# Patient Record
Sex: Male | Born: 1965
Health system: Southern US, Community
[De-identification: ages and names within clinical notes are randomized; demographics above are authoritative.]

## PROBLEM LIST (undated history)

## (undated) DIAGNOSIS — R7611 Nonspecific reaction to tuberculin skin test without active tuberculosis: Secondary | ICD-10-CM

## (undated) DIAGNOSIS — E785 Hyperlipidemia, unspecified: Secondary | ICD-10-CM

## (undated) DIAGNOSIS — E669 Obesity, unspecified: Secondary | ICD-10-CM

## (undated) HISTORY — DX: Nonspecific reaction to tuberculin skin test without active tuberculosis: R76.11

## (undated) HISTORY — DX: Obesity, unspecified: E66.9

## (undated) HISTORY — PX: OTHER SURGICAL HISTORY: SHX169

## (undated) HISTORY — DX: Hyperlipidemia, unspecified: E78.5

---

## 2001-06-24 ENCOUNTER — Emergency Department (HOSPITAL_COMMUNITY): Admission: EM | Admit: 2001-06-24 | Discharge: 2001-06-24 | Payer: Self-pay | Admitting: Emergency Medicine

## 2001-06-24 ENCOUNTER — Encounter: Payer: Self-pay | Admitting: Emergency Medicine

## 2003-11-06 ENCOUNTER — Ambulatory Visit (HOSPITAL_COMMUNITY): Admission: RE | Admit: 2003-11-06 | Discharge: 2003-11-06 | Payer: Self-pay | Admitting: Pediatrics

## 2004-02-01 ENCOUNTER — Inpatient Hospital Stay (HOSPITAL_COMMUNITY): Admission: EM | Admit: 2004-02-01 | Discharge: 2004-02-01 | Payer: Self-pay | Admitting: Emergency Medicine

## 2005-11-18 ENCOUNTER — Ambulatory Visit: Payer: Self-pay | Admitting: Family Medicine

## 2006-05-22 ENCOUNTER — Emergency Department (HOSPITAL_COMMUNITY): Admission: EM | Admit: 2006-05-22 | Discharge: 2006-05-22 | Payer: Self-pay | Admitting: Family Medicine

## 2006-11-19 ENCOUNTER — Emergency Department (HOSPITAL_COMMUNITY): Admission: EM | Admit: 2006-11-19 | Discharge: 2006-11-19 | Payer: Self-pay | Admitting: Emergency Medicine

## 2007-03-01 ENCOUNTER — Ambulatory Visit: Payer: Self-pay | Admitting: Family Medicine

## 2007-12-09 ENCOUNTER — Ambulatory Visit: Payer: Self-pay | Admitting: Family Medicine

## 2007-12-23 ENCOUNTER — Ambulatory Visit: Payer: Self-pay | Admitting: Family Medicine

## 2008-07-05 ENCOUNTER — Ambulatory Visit: Payer: Self-pay | Admitting: Family Medicine

## 2008-12-11 ENCOUNTER — Ambulatory Visit: Payer: Self-pay | Admitting: Family Medicine

## 2008-12-22 ENCOUNTER — Ambulatory Visit: Payer: Self-pay | Admitting: Family Medicine

## 2010-01-11 ENCOUNTER — Ambulatory Visit: Payer: Self-pay | Admitting: Family Medicine

## 2010-03-12 ENCOUNTER — Ambulatory Visit: Payer: Self-pay | Admitting: Family Medicine

## 2010-05-09 ENCOUNTER — Ambulatory Visit: Payer: Self-pay | Admitting: Family Medicine

## 2010-06-08 ENCOUNTER — Encounter: Payer: Self-pay | Admitting: Neurology

## 2010-10-04 NOTE — H&P (Signed)
Jay Rosario                         ACCOUNT NO.:  [id number]   MEDICAL RECORD NO.:  [id number]                   PATIENT TYPE:  INP   LOCATION:  2016                                 FACILITY:  [facility]   PHYSICIAN:  Corinna L. Lendell Caprice, MD             DATE OF BIRTH:  26-Dec-1965   DATE OF ADMISSION:  01/31/2004  DATE OF DISCHARGE:                                HISTORY & PHYSICAL   CHIEF COMPLAINT:  Chest pain.   HISTORY OF PRESENT ILLNESS:  Jay Rosario is a 45 year old black male with  no past medical history who presents to the emergency room with complaints  of a several day history of pleuritic chest pain.  It is located in the left  chest, substernal and back area, worsened by some twisting motion, by some  sitting forward, and certainly with inspiration.  He has had no cough.  He  has had slight shortness of breath.  He has had no recent travel.  He has no  leg pain or swelling.  He has no history of thromboembolism.  He has felt  this in the past but it usually goes away in a few days.   PAST MEDICAL HISTORY:  None.   MEDICATIONS:  None.   ALLERGIES:  None.   SOCIAL HISTORY:  He does not smoke, drink, or use drugs.   FAMILY HISTORY:  His mother has hypertension.   REVIEW OF SYSTEMS:  As above, otherwise negative.   PHYSICAL EXAMINATION:  VITAL SIGNS:  Temperature 98.1, blood pressure  146/84, heart rate 67, respiratory rate 20.  GENERAL:  Patient is well-nourished, well-developed, in no acute distress.  HEENT:  Normocephalic, atraumatic.  Pupils are equal, round, and reactive to  light.  Sclerae nonicteric.  Moist mucous membranes.  NECK:  Supple.  No carotid bruits.  No thyromegaly.  LUNGS:  Clear to auscultation bilaterally without wheezes, rhonchi, or rales  but he does have significant splinting.  CARDIOVASCULAR:  Regular rate and rhythm without murmurs, rubs, or gallops.  No chest wall tenderness.  ABDOMEN:  Soft, nontender, nondistended.  GENITOURINARY:  Deferred.  RECTAL:  Deferred.  EXTREMITIES:  No clubbing, cyanosis, edema.  No calf tenderness.  Homan's  sign negative.  NEUROLOGIC:  Alert and oriented.  Cranial nerves and sensory motor  examination were intact.  PSYCHIATRIC:  Normal affect.   LABORATORIES:  Hemoglobin 17, hematocrit 51.  Basic metabolic panel and  cardiac markers are normal.  EKG shows sinus bradycardia with a rate of 57  and flipped T-waves in the inferolateral leads.  No old EKG for comparison.  Chest x-ray is not currently available, but per ER physician was normal.   ASSESSMENT/PLAN:  Pleuritic chest pain.  Patient will get a CT of the chest  to rule out pulmonary embolism.  He has no cardiac risk factors and this is  very atypical for acute coronary syndrome.  I will, however, check another  set of enzymes in the morning.  I will also try Toradol as this may have a  musculoskeletal component.  He will be admitted to telemetry.                                                Corinna L. Lendell Caprice, MD    CLS/MEDQ  D:  02/01/2004  T:  02/01/2004  Job:  [id number]

## 2010-10-04 NOTE — Op Note (Signed)
NAME:  Jay Rosario, Jay Rosario                         ACCOUNT NO.:  [id number]   MEDICAL RECORD NO.:  [id number]                   PATIENT TYPE:  OUT   LOCATION:  [facility]                                  FACILITY:  [facility]   PHYSICIAN:  Deanna Artis. Sharene Skeans, M.D.           DATE OF BIRTH:  November 17, 1965   DATE OF PROCEDURE:  11/06/2003  DATE OF DISCHARGE:                                 OPERATIVE REPORT   PROCEDURE:  Tensilon test.   INDICATIONS FOR PROCEDURE:  Eyelid ptosis and vertical gaze diplopia with  weakness of the muscles elevating the right eye.   DESCRIPTION OF PROCEDURE:  An IV was placed in the patient's right hand.  The patient was given a test dose of Tensilon 0.1 mL (1 mg) with some  improvement in his extraocular movements but no change in his eyelid.  He  was then given 0.9 mL (9 mg) of Tensilon with fasciculations in his eyelid,  tearing, queasiness.  Heart rate dropped from a baseline of 51 to 48.  During that time, the patient developed effusion of his eye movements based  on red lens test in all directions except for superior vertical gaze.  This  then deteriorated as Tensilon wore off.  There was no apparent change in his  eyelid ptosis during this time.  The patient tolerated the procedure well.   IMPRESSION:  The Tensilon test provides equivocal support for the diagnosis  of ocular myasthenia.  The patient will be placed on Mestinon 30 mg q.i.d.  and will have an MRI scan of the brain with and without contrast to look at  the orbit and brain contents to evaluate other possible etiologies of eye  movement disorder.                                               Deanna Artis. Sharene Skeans, M.D.    [facility]  D:  11/06/2003  T:  11/06/2003  Job:  [id number]   cc:   Elliot Cousin, M.D.

## 2010-10-18 ENCOUNTER — Telehealth: Payer: Self-pay | Admitting: Family Medicine

## 2010-10-21 NOTE — Telephone Encounter (Signed)
Pt daughter called to get name of neurologist that he had seen in [facility], information given to daughter*mh

## 2010-11-14 ENCOUNTER — Encounter: Payer: Self-pay | Admitting: Family Medicine

## 2010-11-18 ENCOUNTER — Ambulatory Visit: Payer: Self-pay | Admitting: Family Medicine

## 2011-02-18 ENCOUNTER — Emergency Department (HOSPITAL_COMMUNITY): Payer: 59

## 2011-02-18 ENCOUNTER — Emergency Department (HOSPITAL_COMMUNITY)
Admission: EM | Admit: 2011-02-18 | Discharge: 2011-02-18 | Disposition: A | Discharge status: Home / Self Care | Payer: 59 | Attending: Emergency Medicine | Admitting: Emergency Medicine

## 2011-02-18 DIAGNOSIS — R51 Headache: Secondary | ICD-10-CM | POA: Insufficient documentation

## 2011-02-18 DIAGNOSIS — R0789 Other chest pain: Secondary | ICD-10-CM | POA: Insufficient documentation

## 2011-02-18 DIAGNOSIS — I10 Essential (primary) hypertension: Secondary | ICD-10-CM | POA: Insufficient documentation

## 2011-02-18 LAB — POCT I-STAT, CHEM 8
Calcium, Ion: 1.22 mmol/L (ref 1.12–1.32)
Glucose, Bld: 96 mg/dL (ref 70–99)
HCT: 46 % (ref 39.0–52.0)
TCO2: 23 mmol/L (ref 0–100)

## 2011-02-18 LAB — POCT I-STAT TROPONIN I: Troponin i, poc: 0 ng/mL (ref 0.00–0.08)

## 2011-02-20 ENCOUNTER — Encounter: Payer: Self-pay | Admitting: Family Medicine

## 2011-02-20 ENCOUNTER — Ambulatory Visit (INDEPENDENT_AMBULATORY_CARE_PROVIDER_SITE_OTHER): Payer: 59 | Admitting: Family Medicine

## 2011-02-20 VITALS — BP 120/90 | HR 113 | Ht 69.0 in | Wt 254.0 lb

## 2011-02-20 DIAGNOSIS — G7000 Myasthenia gravis without (acute) exacerbation: Secondary | ICD-10-CM

## 2011-02-20 DIAGNOSIS — Z23 Encounter for immunization: Secondary | ICD-10-CM

## 2011-02-20 DIAGNOSIS — R030 Elevated blood-pressure reading, without diagnosis of hypertension: Secondary | ICD-10-CM

## 2011-02-20 DIAGNOSIS — IMO0001 Reserved for inherently not codable concepts without codable children: Secondary | ICD-10-CM

## 2011-02-20 NOTE — Progress Notes (Signed)
  Subjective:    Patient ID: Jay Rosario, male    DOB: 01/25/1966, 45 y.o.   MRN: [id number]  HPI He is here for consultation. He was recently seen in the emergency room and evaluated for headache. This started after he ran out of his prednisone that he was taking for myasthenia gravis. He had been off the medicine for 5 days when he had the headache. He was seen in the emergency room and placed on a diuretic to help with his blood pressure and was told that the headache was probably related to this. Review of his record indicates only one encounter with blood pressure was elevated.   Review of Systems     Objective:   Physical Exam Alert and in no distress. Blood pressure is recorded       Assessment & Plan:   1. Need for prophylactic vaccination and inoculation against influenza  Flu vaccine greater than or equal to 3yo preservative free IM  2. Elevated blood pressure    3. Myasthenia gravis     I will have him stop his diuretic and recheck his blood pressure here in one month. I told him that I thought the abrupt withdrawal from the steroids probably cause most of his problem.

## 2011-02-20 NOTE — Patient Instructions (Signed)
Stop your fluid pill and return here in one month

## 2011-03-24 ENCOUNTER — Ambulatory Visit: Payer: 59 | Admitting: Family Medicine

## 2011-04-01 ENCOUNTER — Ambulatory Visit (INDEPENDENT_AMBULATORY_CARE_PROVIDER_SITE_OTHER): Payer: 59 | Admitting: Family Medicine

## 2011-04-01 ENCOUNTER — Encounter: Payer: Self-pay | Admitting: Family Medicine

## 2011-04-01 VITALS — BP 130/90 | HR 64 | Wt 254.0 lb

## 2011-04-01 DIAGNOSIS — E669 Obesity, unspecified: Secondary | ICD-10-CM

## 2011-04-01 DIAGNOSIS — I10 Essential (primary) hypertension: Secondary | ICD-10-CM

## 2011-04-01 DIAGNOSIS — G7000 Myasthenia gravis without (acute) exacerbation: Secondary | ICD-10-CM

## 2011-04-01 MED ORDER — LISINOPRIL-HYDROCHLOROTHIAZIDE 10-12.5 MG PO TABS: 1.0000 | ORAL_TABLET | Freq: Every day | ORAL | Status: DC

## 2011-04-01 NOTE — Progress Notes (Signed)
  Subjective:    Patient ID: Jay Rosario, male    DOB: [birth year], 45 y.o.   MRN: [id number]  HPI He is here for a recheck. He recently saw his neurologist and is on medications listed in the chart. That note was reviewed as well as the blood work. He is now taking HCTZ.   Review of Systems     Objective:   Physical Exam Alert and in no distress. Blood pressure is recorded.       Assessment & Plan:   1. Hypertension   2. Myasthenia gravis    I will switch him to lisinopril/HCTZ for better blood pressure control. Also discussed need to make diet and exercise changes. He will continue to be followed by neurology. Check here in one month.

## 2011-04-01 NOTE — Patient Instructions (Signed)
Stop your fluid pill and start this one. See you in one month

## 2011-05-01 ENCOUNTER — Encounter: Payer: Self-pay | Admitting: Family Medicine

## 2011-05-01 ENCOUNTER — Ambulatory Visit (INDEPENDENT_AMBULATORY_CARE_PROVIDER_SITE_OTHER): Payer: 59 | Admitting: Family Medicine

## 2011-05-01 VITALS — BP 132/88 | HR 75 | Wt 260.0 lb

## 2011-05-01 DIAGNOSIS — E669 Obesity, unspecified: Secondary | ICD-10-CM

## 2011-05-01 DIAGNOSIS — I10 Essential (primary) hypertension: Secondary | ICD-10-CM

## 2011-05-01 NOTE — Progress Notes (Signed)
  Subjective:    Patient ID: Jay Rosario, male    DOB: [birth year], 45 y.o.   MRN: [id number]  HPI He is here for a recheck. He continues on the medicines listed in the chart. He has not made any major dietary changes. He has no concerns or complaints.   Review of Systems     Objective:   Physical Exam Alert and in no distress. Blood pressure is recorded.      Assessment & Plan:   1. Hypertension   2. Obesity (BMI 30-39.9)    I will continue him on his present medication regimen. Had a long discussion with him concerning making major lifestyle changes in regard to diet and exercise.

## 2011-05-01 NOTE — Patient Instructions (Signed)
Keep working on [employer] and exercise program. Remember to cut back on high carbohydrate, white foods

## 2011-08-11 ENCOUNTER — Ambulatory Visit (INDEPENDENT_AMBULATORY_CARE_PROVIDER_SITE_OTHER): Payer: 59 | Admitting: Family Medicine

## 2011-08-11 ENCOUNTER — Encounter: Payer: Self-pay | Admitting: Family Medicine

## 2011-08-11 VITALS — Wt 261.0 lb

## 2011-08-11 DIAGNOSIS — G5710 Meralgia paresthetica, unspecified lower limb: Secondary | ICD-10-CM

## 2011-08-11 DIAGNOSIS — G5711 Meralgia paresthetica, right lower limb: Secondary | ICD-10-CM

## 2011-08-11 NOTE — Progress Notes (Signed)
  Subjective:    Patient ID: Jay Rosario, male    DOB: February 29, 1966, 46 y.o.   MRN: [id number]  HPI He is here for consult concerning difficulty with decreased sensation to the right lateral thigh area. He states that he is still taking steroids for his myasthenia gravis and as do a good job of not gaining too much weight.  Review of Systems     Objective:   Physical Exam Decreased sensation is noted over the right lateral upper thigh area.       Assessment & Plan:   1. Meralgia paresthetica of right side    I explained to him the mechanism of how this occurs. I have discussed this with him in the past and strongly encouraged him to lose weight also if he is wearing any clothing tightly in that area, he should loosen them.

## 2011-10-09 ENCOUNTER — Encounter: Payer: Self-pay | Admitting: Family Medicine

## 2011-10-09 ENCOUNTER — Ambulatory Visit (INDEPENDENT_AMBULATORY_CARE_PROVIDER_SITE_OTHER): Payer: 59 | Admitting: Family Medicine

## 2011-10-09 VITALS — BP 130/90 | HR 74 | Wt 258.0 lb

## 2011-10-09 DIAGNOSIS — L02219 Cutaneous abscess of trunk, unspecified: Secondary | ICD-10-CM

## 2011-10-09 DIAGNOSIS — L02211 Cutaneous abscess of abdominal wall: Secondary | ICD-10-CM

## 2011-10-09 NOTE — Progress Notes (Signed)
  Subjective:    Patient ID: Jay Rosario, male    DOB: [birth year], 46 y.o.   MRN: [id number]  HPI He is here for evaluation of a painful lesion in the left inguinal area. It started draining on Tuesday. He does have a history of myasthenia gravis and presently is on prednisone.   Review of Systems     Objective:   Physical Exam A 2 cm open lesion that is draining slightly is noted in the left inguinal area. It was gently cleaned and dressed .       Assessment & Plan:   1. Abscess of skin of abdomen   Irrigate the area 2 or 3 times per day. Call if any further problems or if more of these develop.

## 2011-10-09 NOTE — Patient Instructions (Addendum)
Irrigate the area 2 or 3 times per day.

## 2011-10-18 ENCOUNTER — Other Ambulatory Visit: Payer: Self-pay | Admitting: Family Medicine

## 2011-10-18 MED ORDER — CLARITHROMYCIN 500 MG PO TABS: 500.0000 mg | ORAL_TABLET | Freq: Two times a day (BID) | ORAL | Status: AC

## 2011-10-18 NOTE — Progress Notes (Signed)
biaxin given for pertussis prevention

## 2011-10-20 ENCOUNTER — Telehealth: Payer: Self-pay

## 2011-10-20 MED ORDER — AZITHROMYCIN 500 MG PO TABS: 500.0000 mg | ORAL_TABLET | Freq: Every day | ORAL | Status: DC

## 2011-10-20 NOTE — Telephone Encounter (Signed)
Dr.Lalonde called pharmacy pharmacist said the z pack is the cheapest for pt please advise what you would like to do

## 2011-10-20 NOTE — Telephone Encounter (Signed)
Med sent in per jcl 

## 2011-10-20 NOTE — Telephone Encounter (Signed)
Go ahead and give him azithromycin 500 mg. One day for 3 days

## 2011-10-22 ENCOUNTER — Other Ambulatory Visit: Payer: Self-pay

## 2011-10-22 MED ORDER — AZITHROMYCIN 500 MG PO TABS: 500.0000 mg | ORAL_TABLET | Freq: Every day | ORAL | Status: DC

## 2011-10-22 NOTE — Telephone Encounter (Signed)
azithromicin is in replacement of clarithromycin per [employer]

## 2012-01-07 ENCOUNTER — Other Ambulatory Visit: Payer: Self-pay

## 2012-01-07 ENCOUNTER — Encounter: Payer: Self-pay | Admitting: Family Medicine

## 2012-01-07 ENCOUNTER — Ambulatory Visit (INDEPENDENT_AMBULATORY_CARE_PROVIDER_SITE_OTHER): Payer: 59 | Admitting: Family Medicine

## 2012-01-07 VITALS — BP 120/80 | HR 80 | Wt 257.0 lb

## 2012-01-07 DIAGNOSIS — G7000 Myasthenia gravis without (acute) exacerbation: Secondary | ICD-10-CM

## 2012-01-07 MED ORDER — PREDNISONE 20 MG PO TABS: 20.0000 mg | ORAL_TABLET | Freq: Every day | ORAL | Status: DC

## 2012-01-07 MED ORDER — LISINOPRIL-HYDROCHLOROTHIAZIDE 10-12.5 MG PO TABS: 1.0000 | ORAL_TABLET | Freq: Every day | ORAL | Status: DC

## 2012-01-07 NOTE — Telephone Encounter (Signed)
Pt B/P med sent in

## 2012-01-07 NOTE — Progress Notes (Signed)
  Subjective:    Patient ID: Jay Rosario, male    DOB: [birth year], 46 y.o.   MRN: [id number]  HPI He is here for consultation concerning difficulty with myasthenia gravis. He finished steroids several months ago and is now starting to note difficulty with visual acuity. He is afraid that if he waits any longer until he can be seen by his specialist the symptoms will worsen.   Review of Systems     Objective:   Physical Exam Alert and in no distress. Slight extraocular muscle changes noted.       Assessment & Plan:   1. Myasthenia gravis    I will treat him with prednisone. He is to followup with his specialist in approximately one week.

## 2012-08-31 ENCOUNTER — Encounter: Payer: Self-pay | Admitting: Family Medicine

## 2012-08-31 ENCOUNTER — Ambulatory Visit (INDEPENDENT_AMBULATORY_CARE_PROVIDER_SITE_OTHER): Payer: 59 | Admitting: Family Medicine

## 2012-08-31 VITALS — BP 118/82 | HR 72 | Ht 68.5 in | Wt 258.0 lb

## 2012-08-31 DIAGNOSIS — Z0000 Encounter for general adult medical examination without abnormal findings: Secondary | ICD-10-CM

## 2012-08-31 DIAGNOSIS — E669 Obesity, unspecified: Secondary | ICD-10-CM

## 2012-08-31 DIAGNOSIS — G7000 Myasthenia gravis without (acute) exacerbation: Secondary | ICD-10-CM

## 2012-08-31 DIAGNOSIS — I10 Essential (primary) hypertension: Secondary | ICD-10-CM

## 2012-08-31 LAB — CBC WITH DIFFERENTIAL/PLATELET
Basophils Absolute: 0 10*3/uL (ref 0.0–0.1)
Basophils Relative: 1 % (ref 0–1)
HCT: 47.8 % (ref 39.0–52.0)
Hemoglobin: 16.8 g/dL (ref 13.0–17.0)
Lymphocytes Relative: 41 % (ref 12–46)
MCHC: 35.1 g/dL (ref 30.0–36.0)
Monocytes Absolute: 0.8 10*3/uL (ref 0.1–1.0)
Neutro Abs: 4.1 10*3/uL (ref 1.7–7.7)
Neutrophils Relative %: 47 % (ref 43–77)
RDW: 13.6 % (ref 11.5–15.5)
WBC: 8.6 10*3/uL (ref 4.0–10.5)

## 2012-08-31 LAB — COMPREHENSIVE METABOLIC PANEL
ALT: 41 U/L (ref 0–53)
AST: 25 U/L (ref 0–37)
Albumin: 4.4 g/dL (ref 3.5–5.2)
Alkaline Phosphatase: 69 U/L (ref 39–117)
Calcium: 9.7 mg/dL (ref 8.4–10.5)
Chloride: 101 mEq/L (ref 96–112)
Potassium: 4 mEq/L (ref 3.5–5.3)
Sodium: 143 mEq/L (ref 135–145)
Total Protein: 7.2 g/dL (ref 6.0–8.3)

## 2012-08-31 LAB — LIPID PANEL
HDL: 64 mg/dL (ref >39)
LDL Cholesterol: 191 mg/dL — ABNORMAL HIGH (ref 0–99)
Triglycerides: 71 mg/dL (ref <150)
VLDL: 14 mg/dL (ref 0–40)

## 2012-08-31 NOTE — Progress Notes (Signed)
Subjective:    Patient ID: Jay Rosario, male    DOB: 08-04-1965, 47 y.o.   MRN: [id number]  HPI He is here for complete examination. He does have a previous history of hypertension but presently has not been on medicines in 6 months. He continues to be followed I neurology for myasthenia gravis and presently is taking 30 mg prednisone daily. He has no weakness or other symptoms. He does relate at one point feeling like his heart was racing with some chest pain. The symptoms lasted 2-3 minutes. He did not check his pulse. He does complain of some slight shoulder discomfort when he abducts his shoulders. He does complain of polyuria and polydipsia. He admits to having a poor diet and not exercising regularly. Social and family history were reviewed.   Review of Systems  Constitutional: Negative.   HENT: Negative.   Eyes: Negative.   Respiratory: Negative.   Cardiovascular: Positive for chest pain.  Gastrointestinal: Negative.   Endocrine: Negative.   Genitourinary: Positive for frequency.  Allergic/Immunologic: Negative.   Neurological: Negative.   Hematological: Negative.   Psychiatric/Behavioral: Negative.        Objective:   Physical Exam BP 118/82  Pulse 72  Ht 5' 8.5" (1.74 m)  Wt 258 lb (117.028 kg)  BMI 38.65 kg/m2  General Appearance:    Alert, cooperative, no distress, appears stated age  Head:    Normocephalic, without obvious abnormality, atraumatic  Eyes:    PERRL, conjunctiva/corneas clear, EOM's intact, fundi    benign  Ears:    Normal TM's and external ear canals  Nose:   Nares normal, mucosa normal, no drainage or sinus   tenderness  Throat:   Lips, mucosa, and tongue normal; teeth and gums normal  Neck:   Supple, no lymphadenopathy;  thyroid:  no   enlargement/tenderness/nodules; no carotid   bruit or JVD  Back:    Spine nontender, no curvature, ROM normal, no CVA     tenderness  Lungs:     Clear to auscultation bilaterally without wheezes, rales or      ronchi; respirations unlabored  Chest Wall:    No tenderness or deformity   Heart:    Regular rate and rhythm, S1 and S2 normal, no murmur, rub   or gallop  Breast Exam:    No chest wall tenderness, masses or gynecomastia  Abdomen:     Soft, non-tender, nondistended, normoactive bowel sounds,    no masses, no hepatosplenomegaly  Genitalia:    Normal male external genitalia without lesions.  Testicles without masses.  No inguinal hernias.  Rectal:    Normal sphincter tone, no masses or tenderness; guaiac negative stool.  Prostate smooth, no nodules, not enlarged.  Extremities:   No clubbing, cyanosis or edema  Pulses:   2+ and symmetric all extremities  Skin:   Skin color, texture, turgor normal, no rashes or lesions  Lymph nodes:   Cervical, supraclavicular, and axillary nodes normal  Neurologic:   CNII-XII intact, normal strength, sensation and gait; reflexes 2+ and symmetric throughout          Psych:   Normal mood, affect, hygiene and grooming.          Assessment & Plan:  Myasthenia gravis  Obesity (BMI 30-39.9)  Hypertension  Routine general medical examination at a health care facility - Plan: Lipid panel, CBC with Differential, Comprehensive metabolic panel had a long discussion with him concerning making diet and exercise changes. Also discussed the fact that  he is along work to be #1 in his life which is interfering with him taking care of himself and interacting with his growing children. Also discussed the steroids that he is on and risk for diabetes as well as bone mineral metabolism. He will discuss this with his neurologist.

## 2012-09-06 ENCOUNTER — Ambulatory Visit (INDEPENDENT_AMBULATORY_CARE_PROVIDER_SITE_OTHER): Payer: 59 | Admitting: Family Medicine

## 2012-09-06 DIAGNOSIS — E785 Hyperlipidemia, unspecified: Secondary | ICD-10-CM | POA: Insufficient documentation

## 2012-09-06 NOTE — Progress Notes (Signed)
  Subjective:    Patient ID: Jay Rosario, male    DOB: 04-18-1966, 47 y.o.   MRN: [id number]  HPI He is here for consult concerning recent blood work. He did show elevated lipids. He is here for further discussion of this.   Review of Systems     Objective:   Physical Exam Alert and in no distress otherwise not examined       Assessment & Plan:  Hyperlipidemia LDL goal < 130 - Plan: Amb ref to Medical Nutrition Therapy-MNT I discussed his lipid levels in regard to risk factors for cardiac disease. We discussed family history, diabetes, hypertension, smoking. Discussed options including medication versus dietary changes. We will set him up for a dietitian consult. Explained to him that no changes need to be permanent. Recheck here in several months.

## 2012-10-09 ENCOUNTER — Emergency Department (HOSPITAL_COMMUNITY)
Admission: EM | Admit: 2012-10-09 | Discharge: 2012-10-09 | Disposition: A | Discharge status: Home / Self Care | Payer: 59 | Source: Home / Self Care | Attending: Emergency Medicine | Admitting: Emergency Medicine

## 2012-10-09 ENCOUNTER — Encounter (HOSPITAL_COMMUNITY): Payer: Self-pay | Admitting: Emergency Medicine

## 2012-10-09 DIAGNOSIS — H109 Unspecified conjunctivitis: Secondary | ICD-10-CM

## 2012-10-09 MED ORDER — TETRACAINE HCL 0.5 % OP SOLN
OPHTHALMIC | Status: AC
  Filled 2012-10-09: qty 2

## 2012-10-09 MED ORDER — TOBRAMYCIN 0.3 % OP SOLN: 1.0000 [drp] | OPHTHALMIC | Status: DC

## 2012-10-09 NOTE — ED Notes (Signed)
Pt c/o right eye irritation and mild pain. Pt states that eye is crusted over in the a.m. Some drainage. Pt denies fever. N/vd. Pt has not used any otc meds for treatment. Symptoms present since Tuesday.

## 2012-10-09 NOTE — ED Provider Notes (Signed)
Chief Complaint:   Chief Complaint  Patient presents with  . Eye Drainage    right eye irritation. mild pain. crusted in the a.m    History of Present Illness:   Jay Rosario is a 47 year old male who has had a five-day history of redness of the right eye with some itching, irritation, burning, and pain. He's had some purulent, yellow drainage and some crusting of the eye. His vision has been normal. He's been exposed to a friend who had a similar problem. He notes a friend with rubbing his eye, and will is marked tone, then given to the patient to take a picture of his eye. Thus he was contaminated directly with ocular secretions from the patient. He denies any fever, chills, nasal congestion, rhinorrhea, headache, sore throat, swollen glands, or cough.  Review of Systems:  Other than noted above, the patient denies any of the following symptoms: Systemic:  No fever, chills, sweats, fatigue, or weight loss. Eye:  No redness, eye pain, photophobia, discharge, blurred vision, or diplopia. ENT:  No nasal congestion, rhinorrhea, or sore throat. Lymphatic:  No adenopathy. Skin:  No rash or pruritis.  PMFSH:  Past medical history, family history, social history, meds, and allergies were reviewed.  He has myasthenia gravis and elevated cholesterol. His only medication is prednisone.  Physical Exam:   Vital signs:  BP 108/64  Pulse 64  Temp(Src) 98.6 F (37 C) (Oral)  Resp 18  SpO2 100% General:  Alert and in no distress. Eye:  Bulbar and palpebral conjunctiva were injected. There was no discharge or drainage in the conjunctival sac and no crusting of the eyelids. Cornea was intact to fluorescein staining, anterior chambers normal, fundi are benign. PERRLA, full EOMs. Intraocular pressure in the right eye was 12 with a 5% confidence interval. ENT:  TMs and canals clear.  Nasal mucosa normal.  No intra-oral lesions, mucous membranes moist, pharynx clear. Neck:  No adenopathy tenderness or  mass. Skin:  Clear, warm and dry.  Assessment:  The encounter diagnosis was Conjunctivitis.  Appears to be infectious conjunctivitis either viral or bacterial.  Plan:   1.  The following meds were prescribed:   Discharge Medication List as of 10/09/2012 11:49 AM    START taking these medications   Details  tobramycin (TOBREX) 0.3 % ophthalmic solution Place 1 drop into the right eye every 4 (four) hours., Starting 10/09/2012, Until Discontinued, Normal       2.  The patient was instructed in symptomatic care and handouts were given. 3.  The patient was told to return if becoming worse in any way, if no better in 3 or 4 days, and given some red flag symptoms such as changes in his vision or worsening pain that would indicate earlier return. 4.  Follow up here if no better in 3 or 4 days.     Reuben Likes, MD 10/09/12 [phone]

## 2012-10-12 ENCOUNTER — Telehealth: Payer: Self-pay | Admitting: Family Medicine

## 2012-10-12 NOTE — Telephone Encounter (Signed)
DONE

## 2012-10-27 ENCOUNTER — Ambulatory Visit: Payer: 59 | Admitting: *Deleted

## 2015-06-21 ENCOUNTER — Ambulatory Visit (INDEPENDENT_AMBULATORY_CARE_PROVIDER_SITE_OTHER): Payer: 59 | Admitting: Family Medicine

## 2015-06-21 ENCOUNTER — Encounter: Payer: Self-pay | Admitting: Family Medicine

## 2015-06-21 VITALS — BP 120/78 | HR 61 | Wt 267.0 lb

## 2015-06-21 DIAGNOSIS — I10 Essential (primary) hypertension: Secondary | ICD-10-CM | POA: Diagnosis not present

## 2015-06-21 DIAGNOSIS — E785 Hyperlipidemia, unspecified: Secondary | ICD-10-CM | POA: Diagnosis not present

## 2015-06-21 DIAGNOSIS — Z0000 Encounter for general adult medical examination without abnormal findings: Secondary | ICD-10-CM | POA: Diagnosis not present

## 2015-06-21 DIAGNOSIS — Z23 Encounter for immunization: Secondary | ICD-10-CM

## 2015-06-21 DIAGNOSIS — E669 Obesity, unspecified: Secondary | ICD-10-CM | POA: Diagnosis not present

## 2015-06-21 DIAGNOSIS — G7000 Myasthenia gravis without (acute) exacerbation: Secondary | ICD-10-CM | POA: Diagnosis not present

## 2015-06-21 LAB — COMPREHENSIVE METABOLIC PANEL
ALK PHOS: 54 U/L (ref 40–115)
ALT: 30 U/L (ref 9–46)
AST: 21 U/L (ref 10–40)
Albumin: 4.1 g/dL (ref 3.6–5.1)
BILIRUBIN TOTAL: 0.5 mg/dL (ref 0.2–1.2)
BUN: 11 mg/dL (ref 7–25)
CALCIUM: 8.8 mg/dL (ref 8.6–10.3)
CO2: 24 mmol/L (ref 20–31)
Chloride: 105 mmol/L (ref 98–110)
Creat: 0.96 mg/dL (ref 0.60–1.35)
GLUCOSE: 77 mg/dL (ref 65–99)
POTASSIUM: 4 mmol/L (ref 3.5–5.3)
Sodium: 139 mmol/L (ref 135–146)
TOTAL PROTEIN: 6.9 g/dL (ref 6.1–8.1)

## 2015-06-21 LAB — CBC WITH DIFFERENTIAL/PLATELET
BASOS ABS: 0 10*3/uL (ref 0.0–0.1)
Basophils Relative: 0 % (ref 0–1)
EOS PCT: 1 % (ref 0–5)
Eosinophils Absolute: 0.1 10*3/uL (ref 0.0–0.7)
HEMATOCRIT: 46.7 % (ref 39.0–52.0)
HEMOGLOBIN: 15.9 g/dL (ref 13.0–17.0)
Lymphocytes Relative: 36 % (ref 12–46)
Lymphs Abs: 2.6 10*3/uL (ref 0.7–4.0)
MCH: 31.9 pg (ref 26.0–34.0)
MCHC: 34 g/dL (ref 30.0–36.0)
MCV: 93.6 fL (ref 78.0–100.0)
MONO ABS: 0.7 10*3/uL (ref 0.1–1.0)
MONOS PCT: 10 % (ref 3–12)
MPV: 9.9 fL (ref 8.6–12.4)
Neutro Abs: 3.9 10*3/uL (ref 1.7–7.7)
Neutrophils Relative %: 53 % (ref 43–77)
Platelets: 237 10*3/uL (ref 150–400)
RBC: 4.99 MIL/uL (ref 4.22–5.81)
RDW: 13.2 % (ref 11.5–15.5)
WBC: 7.3 10*3/uL (ref 4.0–10.5)

## 2015-06-21 LAB — LIPID PANEL
CHOLESTEROL: 253 mg/dL — AB (ref 125–200)
HDL: 58 mg/dL (ref >=40)
LDL Cholesterol: 181 mg/dL — ABNORMAL HIGH (ref <130)
Total CHOL/HDL Ratio: 4.4 Ratio (ref <=5.0)
Triglycerides: 69 mg/dL (ref <150)
VLDL: 14 mg/dL (ref <30)

## 2015-06-21 MED ORDER — NALTREXONE-BUPROPION HCL ER 8-90 MG PO TB12: ORAL_TABLET | ORAL | Status: DC

## 2015-06-21 NOTE — Progress Notes (Signed)
Subjective:    Patient ID: Jay Rosario, male    DOB: 10-19-1965, 50 y.o.   MRN: [id number]  HPI He is here for complete examination. He does have an underlying history of myasthenia gravis and continues on steroids. He does see neurology regularly. They're planning apparently on using a different therapy for this however he is unsure what it is. He is overweight and would like some help with dealing with this. He does have a history of hypertension however has stopped taking his medication approximately one year ago. He also has a history of hyperlipidemia. His work and home life are going well. He has 2 young sons. Family and social history as well as health maintenance and immunizations were reviewed. He has no other concerns or complaints specifically chest pain, shortness of breath or GI issues.   Review of Systems  All other systems reviewed and are negative.      Objective:   Physical Exam BP 120/78 mmHg  Pulse 61  Wt 267 lb (121.11 kg)  SpO2 96%  General Appearance:    Alert, cooperative, no distress, appears stated age  Head:    Normocephalic, without obvious abnormality, atraumatic  Eyes:    PERRL, conjunctiva/corneas clear, EOM's intact, fundi    benign  Ears:    Normal TM's and external ear canals  Nose:   Nares normal, mucosa normal, no drainage or sinus   tenderness  Throat:   Lips, mucosa, and tongue normal; teeth and gums normal  Neck:   Supple, no lymphadenopathy;  thyroid:  no   enlargement/tenderness/nodules; no carotid   bruit or JVD  Back:    Spine nontender, no curvature, ROM normal, no CVA     tenderness  Lungs:     Clear to auscultation bilaterally without wheezes, rales or     ronchi; respirations unlabored  Chest Wall:    No tenderness or deformity   Heart:    Regular rate and rhythm, S1 and S2 normal, no murmur, rub   or gallop  Breast Exam:    No chest wall tenderness, masses or gynecomastia  Abdomen:     Soft, non-tender, nondistended,  normoactive bowel sounds,    no masses, no hepatosplenomegaly  Genitalia:    Normal male external genitalia without lesions.  Testicles without masses.  No inguinal hernias.  Rectal:  Deferred  Extremities:   No clubbing, cyanosis or edema  Pulses:   2+ and symmetric all extremities  Skin:   Skin color, texture, turgor normal, no rashes or lesions  Lymph nodes:   Cervical, supraclavicular, and axillary nodes normal  Neurologic:   CNII-XII intact, normal strength, sensation and gait; reflexes 2+ and symmetric throughout          Psych:   Normal mood, affect, hygiene and grooming.          Assessment & Plan:  Routine general medical examination at a health care facility - Plan: CBC with Differential/Platelet, Comprehensive metabolic panel, Lipid panel  Myasthenia gravis (HCC)  Obesity (BMI 30-39.9) - Plan: Naltrexone-Bupropion HCl ER (CONTRAVE) 8-90 MG TB12, CBC with Differential/Platelet, Comprehensive metabolic panel, Lipid panel  Essential hypertension  Hyperlipidemia with target LDL less than 130 - Plan: Lipid panel  Need for prophylactic vaccination with combined diphtheria-tetanus-pertussis (DTP) vaccine - Plan: Tdap vaccine greater than or equal to 7yo IM Discussed weight loss with him in regard to diet and exercise. Encouraged regular physical activity of at least 20 minutes daily. Also discussed weight loss in  regard to carbohydrate consumption. I will start him on Contrave. Expressive fact that he still needs to make sure that he does the diet and exercise. Stool cards given to him for follow-up on colon cancer screening. Follow-up on his blood pressures since at this time it is normal.

## 2015-07-19 ENCOUNTER — Ambulatory Visit (INDEPENDENT_AMBULATORY_CARE_PROVIDER_SITE_OTHER): Payer: 59 | Admitting: Family Medicine

## 2015-07-19 ENCOUNTER — Encounter: Payer: Self-pay | Admitting: Family Medicine

## 2015-07-19 VITALS — BP 110/90 | HR 72 | Wt 261.4 lb

## 2015-07-19 DIAGNOSIS — E669 Obesity, unspecified: Secondary | ICD-10-CM

## 2015-07-19 NOTE — Progress Notes (Signed)
   Subjective:    Patient ID: Jay Rosario, male    DOB: November 01, 1965, 50 y.o.   MRN: [id number]  HPI He is here for a recheck. He did not start taking his weight loss medication because he started reading the possible side effects and was very uncomfortable with that. Since then he has been able to make some diet and exercise changes and has lost over 5 pounds.   Review of Systems     Objective:   Physical Exam Alert and in no distress otherwise not examined       Assessment & Plan:  Obesity (BMI 30-39.9) I discussed weight loss with him in regard to his overall health. Stone encourage him to continue with his present diet program. Also discussed increasing his physical activity by adding a walking program to his regimen while he is at work. He will attempt to do this. He is to return here in 2 months for recheck

## 2015-07-19 NOTE — Patient Instructions (Addendum)
Use your breaks for exercise.take fruit with you. Take food with you for lunch

## 2015-09-12 ENCOUNTER — Ambulatory Visit (INDEPENDENT_AMBULATORY_CARE_PROVIDER_SITE_OTHER): Payer: 59 | Admitting: Family Medicine

## 2015-09-12 ENCOUNTER — Encounter: Payer: Self-pay | Admitting: Family Medicine

## 2015-09-12 ENCOUNTER — Other Ambulatory Visit: Payer: Self-pay

## 2015-09-12 VITALS — BP 128/80 | HR 76 | Wt 264.0 lb

## 2015-09-12 DIAGNOSIS — S39012A Strain of muscle, fascia and tendon of lower back, initial encounter: Secondary | ICD-10-CM

## 2015-09-12 MED ORDER — CARISOPRODOL 350 MG PO TABS: 350.0000 mg | ORAL_TABLET | ORAL | Status: DC | PRN

## 2015-09-12 MED ORDER — TRAMADOL HCL 50 MG PO TABS: 50.0000 mg | ORAL_TABLET | Freq: Three times a day (TID) | ORAL | Status: DC | PRN

## 2015-09-12 NOTE — Progress Notes (Signed)
   Subjective:    Patient ID: Jay Rosario, male    DOB: 06/13/1965, 50 y.o.   MRN: [id number]  HPI Last Saturday while picking up a box at home he experienced some low back pain, more on the right. Since then he has been trying to take care of this with ibuprofen 800 3 times a day with no benefit. He has had some slight discomfort in the right lateral thigh area.no previous history of this. No numbness, tingling or weakness otherwise.   Review of Systems     Objective:   Physical Exam Pain on motion of his back but no palpable tenderness. Normal lumbar curve with minimal motion. Negative straight leg raising. Normal hip motion. Normal DTRs.       Assessment & Plan:  Back strain, initial encounter - Plan: carisoprodol (SOMA) 350 MG tablet, traMADol (ULTRAM) 50 MG tablet stress proper care of his back with heat, stretching, proper posturing. Will also give tramadol and Soma. Discussed proper use of these medications. Return here if continued difficulty.

## 2015-09-12 NOTE — Patient Instructions (Signed)
He for 20 minutes 3 times per day. Switch to taking 2 Aleve twice per day.

## 2015-09-18 ENCOUNTER — Ambulatory Visit: Payer: 59 | Admitting: Family Medicine

## 2015-09-24 ENCOUNTER — Ambulatory Visit: Payer: 59 | Admitting: Family Medicine

## 2015-09-28 ENCOUNTER — Encounter: Payer: Self-pay | Admitting: Family Medicine

## 2015-11-22 ENCOUNTER — Telehealth: Payer: Self-pay

## 2015-11-22 ENCOUNTER — Other Ambulatory Visit: Payer: Self-pay

## 2015-11-22 DIAGNOSIS — S39012A Strain of muscle, fascia and tendon of lower back, initial encounter: Secondary | ICD-10-CM

## 2015-11-22 MED ORDER — TRAMADOL HCL 50 MG PO TABS: 50.0000 mg | ORAL_TABLET | Freq: Three times a day (TID) | ORAL | Status: DC | PRN

## 2015-11-22 NOTE — Telephone Encounter (Signed)
Called in tramadol per jcl 

## 2015-11-22 NOTE — Telephone Encounter (Signed)
Pt has re-injured his back from 08/2015 visit and requesting pain medication. Please advise if appt is needed. Trixie Rude/RLB

## 2015-11-22 NOTE — Telephone Encounter (Signed)
Call in the tramadol and inform him that if he keeps having trouble, he will need to be seen

## 2015-11-22 NOTE — Telephone Encounter (Signed)
Informed pt wife pam of med called in and if continued trouble he will need to be seen

## 2016-01-03 ENCOUNTER — Ambulatory Visit
Admission: RE | Admit: 2016-01-03 | Discharge: 2016-01-03 | Disposition: A | Discharge status: Home / Self Care | Payer: 59 | Source: Ambulatory Visit | Attending: Family Medicine | Admitting: Family Medicine

## 2016-01-03 ENCOUNTER — Ambulatory Visit (INDEPENDENT_AMBULATORY_CARE_PROVIDER_SITE_OTHER): Payer: 59 | Admitting: Family Medicine

## 2016-01-03 ENCOUNTER — Encounter: Payer: Self-pay | Admitting: Family Medicine

## 2016-01-03 VITALS — BP 120/84 | HR 74 | Wt 264.0 lb

## 2016-01-03 DIAGNOSIS — G7000 Myasthenia gravis without (acute) exacerbation: Secondary | ICD-10-CM

## 2016-01-03 DIAGNOSIS — E669 Obesity, unspecified: Secondary | ICD-10-CM | POA: Diagnosis not present

## 2016-01-03 DIAGNOSIS — M25531 Pain in right wrist: Secondary | ICD-10-CM

## 2016-01-03 DIAGNOSIS — Z23 Encounter for immunization: Secondary | ICD-10-CM | POA: Diagnosis not present

## 2016-01-03 MED ORDER — PREDNISONE 10 MG PO TABS: 10.0000 mg | ORAL_TABLET | Freq: Two times a day (BID) | ORAL | 1 refills | Status: DC

## 2016-01-03 NOTE — Progress Notes (Signed)
   Subjective:    Patient ID: Jay Rosario, male    DOB: 18-Dec-1965, 50 y.o.   MRN: [id number]  HPI He is here to discuss difficulty with double vision. He has a history of myasthenia gravis dating back over 10 years. [facility] and since then has been seeing a neurolog[place] in [place]. He tried to get an appointment with his neurologist but was told he couldn't get one until December. He started out on 60 mg of prednisone and was tapered down to 20 mg and apparently also tried to the down to 10 mg. The taper from 20 mg to 10 was relatively quick and he was started back on prednisone 20 giving 10 mg twice a day. He is running out of his prednisone today. Apparently in the past he was given Fosamax but it does not sound like it was given for very long period of time. He also complains of right wrist pain mainly in the ulnar area. The pain is made worse with physical activity ;no numbness or tingling.   Review of Systems     Objective:   Physical Exam Alert and in no distress. Facial expression appears normal. Right wrist exam does show some pain with flexion and extension but strength is normal. Tinel's test did cause pain in the ulnar aspect of his distal wrist. Phalen test caused immediate pain.       Assessment & Plan:  Myasthenia gravis (HCC) - Plan: Ambulatory referral to Neurology, predniSONE (DELTASONE) 10 MG tablet  Right wrist pain - Plan: DG Wrist Complete Right  Needs flu shot I will get him scheduled with neurology here in town. I will give him prednisone. Discussed with him the dosing of prednisone and the possibility of going with a slower taper and also possibility of going on a bisphosphonate. Will get a follow-up x-ray on his wrist as I'm concerned about the pain and he is having. It does not sound neurologic. Also discussed flu shot and we'll give it to him.

## 2016-01-24 ENCOUNTER — Other Ambulatory Visit: Payer: Self-pay

## 2016-01-24 ENCOUNTER — Telehealth: Payer: Self-pay | Admitting: Family Medicine

## 2016-01-24 DIAGNOSIS — S39012A Strain of muscle, fascia and tendon of lower back, initial encounter: Secondary | ICD-10-CM

## 2016-01-24 MED ORDER — TRAMADOL HCL 50 MG PO TABS: 50.0000 mg | ORAL_TABLET | Freq: Three times a day (TID) | ORAL | 0 refills | Status: DC | PRN

## 2016-01-24 NOTE — Telephone Encounter (Signed)
Called in tramadol per jcl 

## 2016-01-24 NOTE — Telephone Encounter (Signed)
Med has been called in

## 2016-01-24 NOTE — Telephone Encounter (Signed)
Pt called req Tramadol refill for back pain and wrist pain that is keeping him up at night.  [facility]

## 2016-01-24 NOTE — Telephone Encounter (Signed)
Refill the tramadol but explained that if he continues to have wrist pain, we will refer him to a hand specialist.

## 2016-02-27 ENCOUNTER — Other Ambulatory Visit: Payer: Self-pay | Admitting: Family Medicine

## 2016-02-27 DIAGNOSIS — G7000 Myasthenia gravis without (acute) exacerbation: Secondary | ICD-10-CM

## 2016-02-27 NOTE — Telephone Encounter (Signed)
Is this okay?

## 2016-03-05 ENCOUNTER — Ambulatory Visit: Payer: 59 | Admitting: Neurology

## 2016-03-12 ENCOUNTER — Encounter: Payer: Self-pay | Admitting: Family Medicine

## 2016-05-23 ENCOUNTER — Ambulatory Visit: Payer: 59 | Admitting: Neurology

## 2016-05-23 NOTE — Progress Notes (Deleted)
[facility] Clinic Note - Initial Visit   Date: 05/23/16  Jay Rosario MRN: [id number] DOB: 11-Oct-1965   Dear Dr Marland KitchenCarollee Herter, MD:  Thank you for your kind referral of Jay Rosario for consultation of ***. Although *** history is well known to you, please allow [place] to reiterate it for the purpose of our medical record. The patient was accompanied to the clinic by *** who also provides collateral information.     History of Present Illness: Jay Rosario is a 51 y.o. ***-handed Caucasian/*** ***male with *** presenting for evaluation of ***.    Out-side paper records, electronic medical record, and images have been reviewed where available and summarized as: ***  Past Medical History:  Diagnosis Date  . Dyslipidemia   . Myasthenia gravis   . Obesity   . Positive TB test     No past surgical history on file.   Medications:  Outpatient Encounter Prescriptions as of 05/23/2016  Medication Sig  . carisoprodol (SOMA) 350 MG tablet Take 1 tablet (350 mg total) by mouth as needed for muscle spasms. (Patient not taking: Reported on 01/03/2016)  . lisinopril-hydrochlorothiazide (PRINZIDE,ZESTORETIC) 10-12.5 MG per tablet Take 1 tablet by mouth daily. (Patient not taking: Reported on 09/12/2015)  . Naltrexone-Bupropion HCl ER (CONTRAVE) 8-90 MG TB12 1 tablet daily for 1 week then 1 tablet twice a day for one week then 2 tablets twice per day (Patient not taking: Reported on 07/19/2015)  . predniSONE (DELTASONE) 10 MG tablet TAKE 1 TABLET BY MOUTH TWICE A DAY WITH A MEAL  . traMADol (ULTRAM) 50 MG tablet Take 1 tablet (50 mg total) by mouth every 8 (eight) hours as needed.   No facility-administered encounter medications on file as of 05/23/2016.      Allergies: No Known Allergies  Family History: No family history on file.  Social History: Social History  Substance Use Topics  . Smoking status: Never Smoker  . Smokeless tobacco:  Not on file  . Alcohol use No   Social History   Social History Narrative  . No narrative on file    Review of Systems:  CONSTITUTIONAL: No fevers, chills, night sweats, or weight loss.  *** EYES: No visual changes or eye pain ENT: No hearing changes.  No history of nose bleeds.   RESPIRATORY: No cough, wheezing and shortness of breath.   CARDIOVASCULAR: Negative for chest pain, and palpitations.   GI: Negative for abdominal discomfort, blood in stools or black stools.  No recent change in bowel habits.   GU:  No history of incontinence.   MUSCLOSKELETAL: No history of joint pain or swelling.  No myalgias.   SKIN: Negative for lesions, rash, and itching.   HEMATOLOGY/ONCOLOGY: Negative for prolonged bleeding, bruising easily, and swollen nodes.  No history of cancer.   ENDOCRINE: Negative for cold or heat intolerance, polydipsia or goiter.   PSYCH:  ***depression or anxiety symptoms.   NEURO: As Above.   Vital Signs:  There were no vitals taken for this visit. Pain Scale: *** on a scale of 0-10   General Medical Exam:  *** General:  Well appearing, comfortable.   Eyes/ENT: see cranial nerve examination.   Neck: No masses appreciated.  Full range of motion without tenderness.  No carotid bruits. Respiratory:  Clear to auscultation, good air entry bilaterally.   Cardiac:  Regular rate and rhythm, no murmur.   Extremities:  No deformities, edema, or skin discoloration.  Skin:  No rashes  or lesions.  Neurological Exam: MENTAL STATUS including orientation to time, place, person, recent and remote memory, attention span and concentration, language, and fund of knowledge is ***normal.  Speech is not dysarthric.  CRANIAL NERVES: II:  No visual field defects.  Unremarkable fundi.   III-IV-VI: Pupils equal round and reactive to light.  Normal conjugate, extra-ocular eye movements in all directions of gaze.  No nystagmus.  No ptosis***.   V:  Normal facial sensation.  Jaw jerk is  ***.   VII:  Normal facial symmetry and movements.  No pathologic facial reflexes.  VIII:  Normal hearing and vestibular function.   IX-X:  Normal palatal movement.   XI:  Normal shoulder shrug and head rotation.   XII:  Normal tongue strength and range of motion, no deviation or fasciculation.  MOTOR:  No atrophy, fasciculations or abnormal movements.  No pronator drift.  Tone is normal.    Right Upper Extremity:    Left Upper Extremity:    Deltoid  5/5   Deltoid  5/5   Biceps  5/5   Biceps  5/5   Triceps  5/5   Triceps  5/5   Wrist extensors  5/5   Wrist extensors  5/5   Wrist flexors  5/5   Wrist flexors  5/5   Finger extensors  5/5   Finger extensors  5/5   Finger flexors  5/5   Finger flexors  5/5   Dorsal interossei  5/5   Dorsal interossei  5/5   Abductor pollicis  5/5   Abductor pollicis  5/5   Tone (Ashworth scale)  0  Tone (Ashworth scale)  0   Right Lower Extremity:    Left Lower Extremity:    Hip flexors  5/5   Hip flexors  5/5   Hip extensors  5/5   Hip extensors  5/5   Knee flexors  5/5   Knee flexors  5/5   Knee extensors  5/5   Knee extensors  5/5   Dorsiflexors  5/5   Dorsiflexors  5/5   Plantarflexors  5/5   Plantarflexors  5/5   Toe extensors  5/5   Toe extensors  5/5   Toe flexors  5/5   Toe flexors  5/5   Tone (Ashworth scale)  0  Tone (Ashworth scale)  0   MSRs:  Right                                                                 Left brachioradialis 2+  brachioradialis 2+  biceps 2+  biceps 2+  triceps 2+  triceps 2+  patellar 2+  patellar 2+  ankle jerk 2+  ankle jerk 2+  Hoffman no  Hoffman no  plantar response down  plantar response down   SENSORY:  Normal and symmetric perception of light touch, pinprick, vibration, and proprioception.  Romberg's sign absent.   COORDINATION/GAIT: Normal finger-to- nose-finger and heel-to-shin.  Intact rapid alternating movements bilaterally.  Able to rise from a chair without using arms.  Gait narrow based and  stable. Tandem and stressed gait intact.    IMPRESSION: ***  PLAN/RECOMMENDATIONS:  *** Return to clinic in *** months.   The duration of this appointment visit was *** minutes of face-to-face time  with the patient.  Greater than 50% of this time was spent in counseling, explanation of diagnosis, planning of further management, and coordination of care.   Thank you for allowing me to participate in patient's care.  If I can answer any additional questions, I would be pleased to do so.    Sincerely,    Rakeb Kibble K. Allena Katz, DO

## 2016-05-27 ENCOUNTER — Encounter: Payer: Self-pay | Admitting: Neurology

## 2016-08-27 ENCOUNTER — Other Ambulatory Visit: Payer: Self-pay | Admitting: Family Medicine

## 2016-08-27 DIAGNOSIS — G7000 Myasthenia gravis without (acute) exacerbation: Secondary | ICD-10-CM

## 2016-08-27 NOTE — Telephone Encounter (Signed)
Dr.lalonde is this okay to refill 

## 2017-01-17 ENCOUNTER — Other Ambulatory Visit: Payer: Self-pay | Admitting: Family Medicine

## 2017-01-17 DIAGNOSIS — G7000 Myasthenia gravis without (acute) exacerbation: Secondary | ICD-10-CM

## 2017-01-20 NOTE — Telephone Encounter (Signed)
Okay to refill but needs an appointment 

## 2017-01-20 NOTE — Telephone Encounter (Signed)
LM for pt to CB and schedule appt. Jay Rosario/RLB

## 2017-01-21 NOTE — Telephone Encounter (Signed)
Mailbox is full- will mail letter. / RLB

## 2017-02-18 ENCOUNTER — Other Ambulatory Visit: Payer: Self-pay | Admitting: Family Medicine

## 2017-02-18 DIAGNOSIS — G7000 Myasthenia gravis without (acute) exacerbation: Secondary | ICD-10-CM

## 2017-03-05 ENCOUNTER — Other Ambulatory Visit: Payer: Self-pay | Admitting: Family Medicine

## 2017-03-05 DIAGNOSIS — G7000 Myasthenia gravis without (acute) exacerbation: Secondary | ICD-10-CM

## 2017-03-05 NOTE — Telephone Encounter (Signed)
He needs an appointment

## 2017-03-05 NOTE — Telephone Encounter (Signed)
ATTEMPTED CALL TO PT- VCM full.

## 2017-04-13 ENCOUNTER — Other Ambulatory Visit: Payer: Self-pay | Admitting: Family Medicine

## 2017-04-13 DIAGNOSIS — G7000 Myasthenia gravis without (acute) exacerbation: Secondary | ICD-10-CM

## 2017-04-13 NOTE — Telephone Encounter (Signed)
Ok to renew?  

## 2017-04-13 NOTE — Telephone Encounter (Signed)
Attempted call to pt cell- VCM full.

## 2017-04-13 NOTE — Telephone Encounter (Signed)
He needs a an appointment.  Do not let him run out

## 2017-05-22 ENCOUNTER — Other Ambulatory Visit: Payer: Self-pay | Admitting: Family Medicine

## 2017-05-22 DIAGNOSIS — G7000 Myasthenia gravis without (acute) exacerbation: Secondary | ICD-10-CM

## 2017-05-22 NOTE — Telephone Encounter (Signed)
Called about setting up an appt . Pt  Voicemail was full.

## 2017-05-27 ENCOUNTER — Other Ambulatory Visit: Payer: Self-pay | Admitting: Family Medicine

## 2017-05-27 DIAGNOSIS — G7000 Myasthenia gravis without (acute) exacerbation: Secondary | ICD-10-CM

## 2017-05-27 NOTE — Telephone Encounter (Signed)
He needs an appointment.  Give him enough to cover till he comes in

## 2017-05-27 NOTE — Telephone Encounter (Signed)
Is this okay to refill? 

## 2017-05-28 NOTE — Telephone Encounter (Signed)
Called  Pt his voicemail was full, called to set up an appt.

## 2017-06-13 ENCOUNTER — Other Ambulatory Visit: Payer: Self-pay | Admitting: Family Medicine

## 2017-06-13 DIAGNOSIS — G7000 Myasthenia gravis without (acute) exacerbation: Secondary | ICD-10-CM

## 2017-06-15 NOTE — Telephone Encounter (Signed)
Called pt to schedule a follow up on wrist pain . Pt will need to schedule an appt soon. Called pt and no answer and due to his voicemail being full will try to call later in the day.

## 2017-06-15 NOTE — Telephone Encounter (Signed)
I will renew the medicine but make sure he has a follow-up appointment

## 2017-06-15 NOTE — Telephone Encounter (Signed)
Pharmacy is requesting a refill on pt perdnisone . Pt has no upcoming appt. Please advise I this is ok to refill. Thanks [facility]

## 2017-07-31 ENCOUNTER — Other Ambulatory Visit: Payer: Self-pay | Admitting: Family Medicine

## 2017-07-31 DIAGNOSIS — G7000 Myasthenia gravis without (acute) exacerbation: Secondary | ICD-10-CM

## 2017-07-31 NOTE — Telephone Encounter (Signed)
It has been over a year since he has been in here.  Schedule an appointment and then renew his medication to cover till he can come in

## 2017-07-31 NOTE — Telephone Encounter (Signed)
Please advise if [facility] . [facility]

## 2017-07-31 NOTE — Telephone Encounter (Signed)
Awaiting pt to call back to schedule an appt. KH

## 2017-10-13 ENCOUNTER — Encounter: Payer: Self-pay | Admitting: Family Medicine

## 2017-10-13 ENCOUNTER — Ambulatory Visit: Payer: BLUE CROSS/BLUE SHIELD | Admitting: Family Medicine

## 2017-10-13 VITALS — BP 126/80 | HR 63 | Temp 97.9°F | Ht 68.0 in | Wt 259.6 lb

## 2017-10-13 DIAGNOSIS — E785 Hyperlipidemia, unspecified: Secondary | ICD-10-CM | POA: Diagnosis not present

## 2017-10-13 DIAGNOSIS — Z79899 Other long term (current) drug therapy: Secondary | ICD-10-CM

## 2017-10-13 DIAGNOSIS — Z13820 Encounter for screening for osteoporosis: Secondary | ICD-10-CM

## 2017-10-13 DIAGNOSIS — G7000 Myasthenia gravis without (acute) exacerbation: Secondary | ICD-10-CM | POA: Diagnosis not present

## 2017-10-13 DIAGNOSIS — E669 Obesity, unspecified: Secondary | ICD-10-CM

## 2017-10-13 DIAGNOSIS — N529 Male erectile dysfunction, unspecified: Secondary | ICD-10-CM | POA: Diagnosis not present

## 2017-10-13 DIAGNOSIS — Z7952 Long term (current) use of systemic steroids: Secondary | ICD-10-CM

## 2017-10-13 MED ORDER — PREDNISONE 10 MG PO TABS: ORAL_TABLET | ORAL | 3 refills | Status: DC

## 2017-10-13 MED ORDER — TADALAFIL 20 MG PO TABS: 20.0000 mg | ORAL_TABLET | Freq: Every day | ORAL | 2 refills | Status: DC | PRN

## 2017-10-13 NOTE — Progress Notes (Signed)
   Subjective:    Patient ID: Jay Rosario, male    DOB: 02-Sep-1965, 52 y.o.   MRN: [id number]  HPI He is here for follow-up visit.  He is not very quite some time.  He did run out of his steroids and does note a return of his myasthenia gravis symptoms mainly of visual changes.  Blurred and double vision as well as weakness of the eyelids.  He was originally treated at [facility] and has apparently tried other medications without much success.  He has been stable on steroids for several years.  He has not been on a bisphosphonate.  He also has a history of hyperlipidemia.  Most recently has had difficulty with erectile dysfunction, getting and maintaining erections.  He does have a previous history of hypertension however has not been on medicines in quite some time and does have normal blood pressure.  His work and home life seem to be going well.  Review of Systems     Objective:   Physical Exam Alert and in no distress otherwise not examined       Assessment & Plan:  Obesity (BMI 30-39.9) - Plan: CBC with Differential/Platelet, Comprehensive metabolic panel, Lipid panel  Myasthenia gravis (HCC) - Plan: predniSONE (DELTASONE) 10 MG tablet, DG Bone Density  Hyperlipidemia with target LDL less than 130 - Plan: Lipid panel  Erectile dysfunction, unspecified erectile dysfunction type - Plan: tadalafil (CIALIS) 20 MG tablet, CBC with Differential/Platelet, Comprehensive metabolic panel, Lipid panel  Screening for osteoporosis - Plan: DG Bone Density  Current chronic use of systemic steroids - Plan: DG Bone Density  Encounter for long-term (current) use of medications - Plan: CBC with Differential/Platelet, Comprehensive metabolic panel, Lipid panel I will renew his medication.  We will follow-up with a DEXA scan.  Encouraged him to set up a complete exam in the near future.  Also discussed his weight and again stressed the need for him to make diet and exercise changes.   Discussed the risks of other diseases in conjunction with his weight.

## 2017-10-14 ENCOUNTER — Other Ambulatory Visit: Payer: Self-pay

## 2017-10-14 DIAGNOSIS — G7000 Myasthenia gravis without (acute) exacerbation: Secondary | ICD-10-CM

## 2017-10-14 LAB — COMPREHENSIVE METABOLIC PANEL
ALBUMIN: 4.1 g/dL (ref 3.5–5.5)
ALK PHOS: 65 IU/L (ref 39–117)
ALT: 32 IU/L (ref 0–44)
AST: 22 IU/L (ref 0–40)
Albumin/Globulin Ratio: 1.7 (ref 1.2–2.2)
BUN / CREAT RATIO: 11 (ref 9–20)
BUN: 10 mg/dL (ref 6–24)
Bilirubin Total: <0.2 mg/dL (ref 0.0–1.2)
CHLORIDE: 107 mmol/L — AB (ref 96–106)
CO2: 22 mmol/L (ref 20–29)
Calcium: 9 mg/dL (ref 8.7–10.2)
Creatinine, Ser: 0.94 mg/dL (ref 0.76–1.27)
GFR calc Af Amer: 109 mL/min/{1.73_m2} (ref >59)
GFR calc non Af Amer: 94 mL/min/{1.73_m2} (ref >59)
GLOBULIN, TOTAL: 2.4 g/dL (ref 1.5–4.5)
GLUCOSE: 79 mg/dL (ref 65–99)
Potassium: 4.1 mmol/L (ref 3.5–5.2)
SODIUM: 139 mmol/L (ref 134–144)
Total Protein: 6.5 g/dL (ref 6.0–8.5)

## 2017-10-14 LAB — LIPID PANEL
CHOLESTEROL TOTAL: 221 mg/dL — AB (ref 100–199)
Chol/HDL Ratio: 5 ratio (ref 0.0–5.0)
HDL: 44 mg/dL (ref >39)
LDL Calculated: 147 mg/dL — ABNORMAL HIGH (ref 0–99)
Triglycerides: 150 mg/dL — ABNORMAL HIGH (ref 0–149)
VLDL Cholesterol Cal: 30 mg/dL (ref 5–40)

## 2017-10-14 LAB — CBC WITH DIFFERENTIAL/PLATELET
BASOS ABS: 0 10*3/uL (ref 0.0–0.2)
Basos: 1 %
EOS (ABSOLUTE): 0.3 10*3/uL (ref 0.0–0.4)
Eos: 4 %
HEMOGLOBIN: 15.8 g/dL (ref 13.0–17.7)
Hematocrit: 47.2 % (ref 37.5–51.0)
Immature Grans (Abs): 0 10*3/uL (ref 0.0–0.1)
Immature Granulocytes: 0 %
LYMPHS ABS: 2.5 10*3/uL (ref 0.7–3.1)
Lymphs: 39 %
MCH: 31.7 pg (ref 26.6–33.0)
MCHC: 33.5 g/dL (ref 31.5–35.7)
MCV: 95 fL (ref 79–97)
MONOCYTES: 11 %
MONOS ABS: 0.7 10*3/uL (ref 0.1–0.9)
NEUTROS ABS: 2.8 10*3/uL (ref 1.4–7.0)
Neutrophils: 45 %
Platelets: 213 10*3/uL (ref 150–450)
RBC: 4.98 x10E6/uL (ref 4.14–5.80)
RDW: 13.7 % (ref 12.3–15.4)
WBC: 6.4 10*3/uL (ref 3.4–10.8)

## 2017-10-14 MED ORDER — PREDNISONE 10 MG PO TABS: ORAL_TABLET | ORAL | 3 refills | Status: DC

## 2017-12-28 ENCOUNTER — Encounter: Payer: Self-pay | Admitting: Family Medicine

## 2017-12-29 ENCOUNTER — Encounter: Payer: Self-pay | Admitting: Family Medicine

## 2017-12-29 ENCOUNTER — Ambulatory Visit: Payer: BLUE CROSS/BLUE SHIELD | Admitting: Family Medicine

## 2017-12-29 VITALS — BP 120/78 | HR 66 | Temp 97.8°F | Ht 69.75 in | Wt 258.6 lb

## 2017-12-29 DIAGNOSIS — Z13820 Encounter for screening for osteoporosis: Secondary | ICD-10-CM

## 2017-12-29 DIAGNOSIS — G7000 Myasthenia gravis without (acute) exacerbation: Secondary | ICD-10-CM

## 2017-12-29 DIAGNOSIS — N529 Male erectile dysfunction, unspecified: Secondary | ICD-10-CM

## 2017-12-29 DIAGNOSIS — E785 Hyperlipidemia, unspecified: Secondary | ICD-10-CM

## 2017-12-29 DIAGNOSIS — Z79899 Other long term (current) drug therapy: Secondary | ICD-10-CM

## 2017-12-29 DIAGNOSIS — Z0000 Encounter for general adult medical examination without abnormal findings: Secondary | ICD-10-CM

## 2017-12-29 DIAGNOSIS — E669 Obesity, unspecified: Secondary | ICD-10-CM

## 2017-12-29 DIAGNOSIS — Z1211 Encounter for screening for malignant neoplasm of colon: Secondary | ICD-10-CM

## 2017-12-29 LAB — CBC WITH DIFFERENTIAL/PLATELET
BASOS: 1 %
Basophils Absolute: 0 10*3/uL (ref 0.0–0.2)
EOS (ABSOLUTE): 0.2 10*3/uL (ref 0.0–0.4)
EOS: 3 %
HEMATOCRIT: 49 % (ref 37.5–51.0)
HEMOGLOBIN: 16.3 g/dL (ref 13.0–17.7)
Immature Grans (Abs): 0 10*3/uL (ref 0.0–0.1)
Immature Granulocytes: 1 %
LYMPHS ABS: 2.9 10*3/uL (ref 0.7–3.1)
Lymphs: 45 %
MCH: 31.6 pg (ref 26.6–33.0)
MCHC: 33.3 g/dL (ref 31.5–35.7)
MCV: 95 fL (ref 79–97)
Monocytes Absolute: 0.6 10*3/uL (ref 0.1–0.9)
Monocytes: 9 %
NEUTROS ABS: 2.7 10*3/uL (ref 1.4–7.0)
Neutrophils: 41 %
Platelets: 219 10*3/uL (ref 150–450)
RBC: 5.16 x10E6/uL (ref 4.14–5.80)
RDW: 13.6 % (ref 12.3–15.4)
WBC: 6.5 10*3/uL (ref 3.4–10.8)

## 2017-12-29 LAB — LIPID PANEL
Chol/HDL Ratio: 4.2 ratio (ref 0.0–5.0)
Cholesterol, Total: 244 mg/dL — ABNORMAL HIGH (ref 100–199)
HDL: 58 mg/dL (ref >39)
LDL CALC: 168 mg/dL — AB (ref 0–99)
TRIGLYCERIDES: 89 mg/dL (ref 0–149)
VLDL Cholesterol Cal: 18 mg/dL (ref 5–40)

## 2017-12-29 LAB — POCT URINALYSIS DIP (PROADVANTAGE DEVICE)
BILIRUBIN UA: NEGATIVE
Glucose, UA: NEGATIVE mg/dL
Ketones, POC UA: NEGATIVE mg/dL
LEUKOCYTES UA: NEGATIVE
NITRITE UA: NEGATIVE
Protein Ur, POC: NEGATIVE mg/dL
Specific Gravity, Urine: 1.03
UUROB: 3.5
pH, UA: 6 (ref 5.0–8.0)

## 2017-12-29 LAB — COMPREHENSIVE METABOLIC PANEL
A/G RATIO: 1.5 (ref 1.2–2.2)
ALBUMIN: 4 g/dL (ref 3.5–5.5)
ALK PHOS: 64 IU/L (ref 39–117)
ALT: 34 IU/L (ref 0–44)
AST: 26 IU/L (ref 0–40)
BUN / CREAT RATIO: 12 (ref 9–20)
BUN: 13 mg/dL (ref 6–24)
Bilirubin Total: 0.3 mg/dL (ref 0.0–1.2)
CO2: 22 mmol/L (ref 20–29)
Calcium: 9 mg/dL (ref 8.7–10.2)
Chloride: 106 mmol/L (ref 96–106)
Creatinine, Ser: 1.08 mg/dL (ref 0.76–1.27)
GFR calc Af Amer: 92 mL/min/{1.73_m2} (ref >59)
GFR calc non Af Amer: 80 mL/min/{1.73_m2} (ref >59)
GLOBULIN, TOTAL: 2.7 g/dL (ref 1.5–4.5)
Glucose: 95 mg/dL (ref 65–99)
POTASSIUM: 4.2 mmol/L (ref 3.5–5.2)
SODIUM: 141 mmol/L (ref 134–144)
Total Protein: 6.7 g/dL (ref 6.0–8.5)

## 2017-12-29 MED ORDER — TADALAFIL 20 MG PO TABS: 20.0000 mg | ORAL_TABLET | Freq: Every day | ORAL | 2 refills | Status: DC | PRN

## 2017-12-29 NOTE — Progress Notes (Signed)
Subjective:    Patient ID: Jay Rosario, male    DOB: Aug 05, 1965, 52 y.o.   MRN: [id number]  HPI He is here for a complete exam.  He does have underlying myasthenia gravis and presently is taking 20 mg of prednisone on a regular basis.  [facility].  He also has ED and does use Cialis on an as-needed basis.  Otherwise he is on no other medications.  He has no other concerns or complaints.  No history of allergies, cardiac or GI issues.  He does not smoke or drink.  His exercise is quite minimal.  His marriage is going well.  He has 2 teenage children.  Family and social history as well as health maintenance and immunization was reviewed.   Review of Systems  All other systems reviewed and are negative.      Objective:   Physical Exam BP 120/78 (BP Location: Left Arm, Patient Position: Sitting)   Pulse 66   Temp 97.8 F (36.6 C)   Ht 5' 9.75" (1.772 m)   Wt 258 lb 9.6 oz (117.3 kg)   SpO2 95%   BMI 37.37 kg/m   General Appearance:    Alert, cooperative, no distress, appears stated age  Head:    Normocephalic, without obvious abnormality, atraumatic  Eyes:    PERRL, conjunctiva/corneas clear, EOM's intact, fundi    benign  Ears:    Normal TM's and external ear canals  Nose:   Nares normal, mucosa normal, no drainage or sinus   tenderness  Throat:   Lips, mucosa, and tongue normal; teeth and gums normal  Neck:   Supple, no lymphadenopathy;  thyroid:  no   enlargement/tenderness/nodules; no carotid   bruit or JVD  Back:    Spine nontender, no curvature, ROM normal, no CVA     tenderness  Lungs:     Clear to auscultation bilaterally without wheezes, rales or     ronchi; respirations unlabored  Chest Wall:    No tenderness or deformity   Heart:    Regular rate and rhythm, S1 and S2 normal, no murmur, rub   or gallop  Breast Exam:    No chest wall tenderness, masses or gynecomastia  Abdomen:     Soft, non-tender, nondistended, normoactive bowel  sounds,    no masses, no hepatosplenomegaly  Genitalia:   Deferred  Rectal:   Deferred  Extremities:   No clubbing, cyanosis or edema  Pulses:   2+ and symmetric all extremities  Skin:   Skin color, texture, turgor normal, no rashes or lesions  Lymph nodes:   Cervical, supraclavicular, and axillary nodes normal  Neurologic:   CNII-XII intact, normal strength, sensation and gait; reflexes 2+ and symmetric throughout          Psych:   Normal mood, affect, hygiene and grooming.          Assessment & Plan:  Routine general medical examination at a health care facility - Plan: CBC with Differential/Platelet, Comprehensive metabolic panel, Lipid panel  Obesity (BMI 30-39.9)  Myasthenia gravis (HCC)  Hyperlipidemia with target LDL less than 130 - Plan: Lipid panel  Erectile dysfunction, unspecified erectile dysfunction type  Screening for osteoporosis - Plan: DG Bone Density  Encounter for long-term (current) use of medications - Plan: CBC with Differential/Platelet, Comprehensive metabolic panel, Lipid panel  Screening for colon cancer - Plan: Cologuard Discussed possibly placing him on a bisphosphonate but will also do a DEXA scan.  Discussed his weight in regard to diet and exercise.  Recommended 5 minutes during his morning break, 10 minutes at lunch and in 5 minutes in the afternoon break to go for a walk while at work.  Also discussed cutting back on carbohydrates.  Cialis was called in for him.  Also discussed colon cancer screening and since there is no family history, I will order Cologuard.

## 2017-12-29 NOTE — Patient Instructions (Signed)
20 minutes of something physical daily or 150 minutes a week of something physical Cut white food consumption in half Work on getting your waist size down to 36

## 2017-12-30 ENCOUNTER — Encounter: Payer: Self-pay | Admitting: Family Medicine

## 2017-12-30 ENCOUNTER — Telehealth: Payer: Self-pay

## 2017-12-30 NOTE — Telephone Encounter (Signed)
Called pt to advise him that of bone density. His appt is 02-16-18 at 7:10 am. Pt need to stop any calcium or multi vitamin with calcium 48 hours before dexa scan. If he needs to change the appt he can call [phone]. KH

## 2018-01-08 DIAGNOSIS — Z1211 Encounter for screening for malignant neoplasm of colon: Secondary | ICD-10-CM | POA: Diagnosis not present

## 2018-01-08 LAB — COLOGUARD: COLOGUARD: NEGATIVE

## 2018-01-13 ENCOUNTER — Encounter: Payer: Self-pay | Admitting: Family Medicine

## 2018-01-14 ENCOUNTER — Telehealth: Payer: Self-pay

## 2018-01-14 NOTE — Telephone Encounter (Signed)
Pt was called to advise of negative cologuard and that we do have the flu shots available . KH

## 2018-02-16 ENCOUNTER — Other Ambulatory Visit: Payer: 59

## 2018-05-10 ENCOUNTER — Other Ambulatory Visit: Payer: Self-pay

## 2018-05-10 ENCOUNTER — Encounter (HOSPITAL_COMMUNITY): Payer: Self-pay | Admitting: *Deleted

## 2018-05-10 ENCOUNTER — Emergency Department (HOSPITAL_COMMUNITY)
Admission: EM | Admit: 2018-05-10 | Discharge: 2018-05-11 | Disposition: A | Discharge status: Home / Self Care | Payer: BLUE CROSS/BLUE SHIELD | Attending: Emergency Medicine | Admitting: Emergency Medicine

## 2018-05-10 ENCOUNTER — Emergency Department (HOSPITAL_COMMUNITY): Payer: BLUE CROSS/BLUE SHIELD

## 2018-05-10 DIAGNOSIS — G7000 Myasthenia gravis without (acute) exacerbation: Secondary | ICD-10-CM | POA: Insufficient documentation

## 2018-05-10 DIAGNOSIS — Z79899 Other long term (current) drug therapy: Secondary | ICD-10-CM | POA: Diagnosis not present

## 2018-05-10 DIAGNOSIS — R079 Chest pain, unspecified: Secondary | ICD-10-CM | POA: Diagnosis not present

## 2018-05-10 DIAGNOSIS — R0789 Other chest pain: Secondary | ICD-10-CM | POA: Diagnosis not present

## 2018-05-10 LAB — CBC
HCT: 47.9 % (ref 39.0–52.0)
Hemoglobin: 15.6 g/dL (ref 13.0–17.0)
MCH: 31.1 pg (ref 26.0–34.0)
MCHC: 32.6 g/dL (ref 30.0–36.0)
MCV: 95.4 fL (ref 80.0–100.0)
Platelets: 205 10*3/uL (ref 150–400)
RBC: 5.02 MIL/uL (ref 4.22–5.81)
RDW: 12.9 % (ref 11.5–15.5)
WBC: 7.8 10*3/uL (ref 4.0–10.5)
nRBC: 0 % (ref 0.0–0.2)

## 2018-05-10 LAB — BASIC METABOLIC PANEL
Anion gap: 11 (ref 5–15)
BUN: 10 mg/dL (ref 6–20)
CO2: 25 mmol/L (ref 22–32)
Calcium: 9.2 mg/dL (ref 8.9–10.3)
Chloride: 106 mmol/L (ref 98–111)
Creatinine, Ser: 1.02 mg/dL (ref 0.61–1.24)
GFR calc Af Amer: >60 mL/min (ref >60)
GFR calc non Af Amer: >60 mL/min (ref >60)
Glucose, Bld: 105 mg/dL — ABNORMAL HIGH (ref 70–99)
Potassium: 4.1 mmol/L (ref 3.5–5.1)
Sodium: 142 mmol/L (ref 135–145)

## 2018-05-10 LAB — TROPONIN I: Troponin I: <0.03 ng/mL (ref <0.03)

## 2018-05-10 NOTE — ED Triage Notes (Signed)
The pt has had chest pain since this am no other symptoms no previous history he went to urgent care [place] and was ent here for treatmentt no chest pain now

## 2018-05-11 LAB — I-STAT TROPONIN, ED: Troponin i, poc: 0 ng/mL (ref 0.00–0.08)

## 2018-05-11 NOTE — ED Provider Notes (Signed)
[facility] EMERGENCY DEPARTMENT Provider Note   CSN: [id number] Arrival date & time: 05/10/18  2040     History   Chief Complaint Chief Complaint  Patient presents with  . Chest Pain    HPI Jay Rosario is a 52 y.o. male.  HPI  This is a 52 year old male with a history of hyperlipidemia who presents with chest pain.  Patient reports onset of chest pain 24 hours ago.  It comes and goes.  It is not associated with exertion, deep breathing, or eating.  He describes it as achy and nonradiating.  He currently is chest pain-free.  Denies any associated nausea, shortness of breath, fevers, cough.  Denies any lower extremity swelling, recent travel or history of blood clots.  Past Medical History:  Diagnosis Date  . Dyslipidemia   . Myasthenia gravis   . Obesity   . Positive TB test     Patient Active Problem List   Diagnosis Date Noted  . Erectile dysfunction 12/29/2017  . Hyperlipidemia with target LDL less than 130 09/06/2012  . Obesity (BMI 30-39.9) 04/01/2011  . Myasthenia gravis (HCC) 02/20/2011    History reviewed. No pertinent surgical history.      Home Medications    Prior to Admission medications   Medication Sig Start Date End Date Taking? Authorizing Provider  carisoprodol (SOMA) 350 MG tablet Take 1 tablet (350 mg total) by mouth as needed for muscle spasms. Patient not taking: Reported on 10/13/2017 09/12/15   Ronnald Nian, MD  lisinopril-hydrochlorothiazide (PRINZIDE,ZESTORETIC) 10-12.5 MG per tablet Take 1 tablet by mouth daily. Patient not taking: Reported on 09/12/2015 01/07/12 01/06/13  Ronnald Nian, MD  Naltrexone-Bupropion HCl ER (CONTRAVE) 8-90 MG TB12 1 tablet daily for 1 week then 1 tablet twice a day for one week then 2 tablets twice per day Patient not taking: Reported on 07/19/2015 06/21/15   Ronnald Nian, MD  naproxen sodium (ALEVE) 220 MG tablet Take 220 mg by mouth.    [provider]  predniSONE  (DELTASONE) 10 MG tablet TAKE 1 TABLET BY MOUTH TWICE A DAY WITH A MEAL 10/14/17   Ronnald Nian, MD  tadalafil (CIALIS) 20 MG tablet Take 1 tablet (20 mg total) by mouth daily as needed for erectile dysfunction. 12/29/17   Ronnald Nian, MD  traMADol (ULTRAM) 50 MG tablet Take 1 tablet (50 mg total) by mouth every 8 (eight) hours as needed. Patient not taking: Reported on 10/13/2017 01/24/16   Ronnald Nian, MD    Family History Family History  Problem Relation Age of Onset  . Hypertension Mother     Social History Social History   Tobacco Use  . Smoking status: Never Smoker  . Smokeless tobacco: Never Used  Substance Use Topics  . Alcohol use: No  . Drug use: No     Allergies   Patient has no known allergies.   Review of Systems Review of Systems  Constitutional: Negative for fever.  Respiratory: Negative for shortness of breath.   Cardiovascular: Positive for chest pain. Negative for leg swelling.  Gastrointestinal: Negative for abdominal pain, nausea and vomiting.  Genitourinary: Negative for dysuria.  All other systems reviewed and are negative.    Physical Exam Updated Vital Signs BP (!) 130/94 (BP Location: Right Arm)   Pulse (!) 52   Temp 98.4 F (36.9 C) (Oral)   Resp 16   Ht 1.676 m (5\' 6" )   Wt 117.9 kg   SpO2  98%   BMI 41.97 kg/m   Physical Exam Vitals signs and nursing note reviewed.  Constitutional:      Appearance: He is well-developed.     Comments: Overweight but nontoxic-appearing  HENT:     Head: Normocephalic and atraumatic.  Eyes:     Pupils: Pupils are equal, round, and reactive to light.  Neck:     Musculoskeletal: Neck supple.  Cardiovascular:     Rate and Rhythm: Normal rate and regular rhythm.     Heart sounds: Normal heart sounds. No murmur.  Pulmonary:     Effort: Pulmonary effort is normal. No respiratory distress.     Breath sounds: Normal breath sounds. No wheezing.  Abdominal:     General: Bowel sounds are normal.       Palpations: Abdomen is soft.     Tenderness: There is no abdominal tenderness. There is no rebound.  Musculoskeletal:     Right lower leg: No edema.     Left lower leg: No edema.  Lymphadenopathy:     Cervical: No cervical adenopathy.  Skin:    General: Skin is warm and dry.  Neurological:     Mental Status: He is alert and oriented to person, place, and time.      ED Treatments / Results  Labs (all labs ordered are listed, but only abnormal results are displayed) Labs Reviewed  BASIC METABOLIC PANEL - Abnormal; Notable for the following components:      Result Value   Glucose, Bld 105 (*)    All other components within normal limits  CBC  TROPONIN I  I-STAT TROPONIN, ED    EKG EKG Interpretation  Date/Time:  Monday May 10 2018 20:48:43 EST Ventricular Rate:  66 PR Interval:  144 QRS Duration: 90 QT Interval:  366 QTC Calculation: 383 R Axis:   71 Text Interpretation:  Normal sinus rhythm Cannot rule out Inferior infarct , age undetermined Abnormal ECG INferior changes seen on prior Confirmed by Ross MarcusHorton, Edmon Magid ([id number]) on 05/11/2018 5:53:44 AM   Radiology Dg Chest 2 View  Result Date: 05/10/2018 CLINICAL DATA:  Chest pain EXAM: CHEST - 2 VIEW COMPARISON:  02/18/2011 FINDINGS: The heart size and mediastinal contours are within normal limits. Both lungs are clear. The visualized skeletal structures are unremarkable. IMPRESSION: No active cardiopulmonary disease. Electronically Signed   By: Alcide CleverMark  Lukens M.D.   On: 05/10/2018 21:33    Procedures Procedures (including critical care time)  Medications Ordered in ED Medications - No data to display   Initial Impression / Assessment and Plan / ED Course  I have reviewed the triage vital signs and the nursing notes.  Pertinent labs & imaging results that were available during my care of the patient were reviewed by me and considered in my medical decision making (see chart for details).     Patient  presents with chest pain.  Very atypical in nature.  Ongoing and intermittent over last 24 hours.  He is currently chest pain-free.  EKG shows no signs of ischemia.  Chest x-ray shows no evidence of pneumothorax or pneumonia.  Initial troponin is negative.  This was initially obtained at 2114 yesterday.  Repeat troponin at 5 AM remains negative.  Patient is relatively low risk with a heart score of 2.  Recommend close follow-up with cardiology.  Doubt PE as he has no risk factors and his vital signs have remained stable.  No signs or symptoms of dissection or characteristics concerning for dissection.  After history,  exam, and medical workup I feel the patient has been appropriately medically screened and is safe for discharge home. Pertinent diagnoses were discussed with the patient. Patient was given return precautions.   Final Clinical Impressions(s) / ED Diagnoses   Final diagnoses:  Atypical chest pain    ED Discharge Orders    None       Chasady Longwell, Mayer Maskerourtney F, MD 05/11/18 [phone]

## 2018-05-11 NOTE — ED Notes (Signed)
Pt discharged from ED; instructions provided; Pt encouraged to return to ED if symptoms worsen and to f/u with PCP; Pt verbalized understanding of all instructions 

## 2018-05-11 NOTE — Discharge Instructions (Addendum)
You were seen today for chest pain.  Your work-up is reassuring including an EKG and troponin.  Follow-up with cardiology given your risk factors.  You may need stress testing.  If you develop any new or worsening symptoms you should be reevaluated.

## 2018-11-13 ENCOUNTER — Other Ambulatory Visit: Payer: Self-pay | Admitting: Family Medicine

## 2018-11-13 DIAGNOSIS — G7000 Myasthenia gravis without (acute) exacerbation: Secondary | ICD-10-CM

## 2018-11-15 NOTE — Telephone Encounter (Signed)
[facility] please advise [facility]

## 2019-05-28 ENCOUNTER — Other Ambulatory Visit: Payer: Self-pay | Admitting: Family Medicine

## 2019-05-28 DIAGNOSIS — G7000 Myasthenia gravis without (acute) exacerbation: Secondary | ICD-10-CM

## 2019-05-30 NOTE — Telephone Encounter (Signed)
[facility] to [facility]nis[facility] Please advise [facility] 

## 2019-08-23 ENCOUNTER — Other Ambulatory Visit: Payer: Self-pay | Admitting: Family Medicine

## 2019-08-23 DIAGNOSIS — G7000 Myasthenia gravis without (acute) exacerbation: Secondary | ICD-10-CM

## 2019-08-24 NOTE — Telephone Encounter (Signed)
[facility] to [facility] prednisone. Please advise [facility]

## 2019-10-25 ENCOUNTER — Telehealth: Payer: Self-pay

## 2019-10-25 NOTE — Telephone Encounter (Signed)
Pt would like to know if he can get the covid vaccine . Pt has the Myasthenia gravis. Please advise [facility]

## 2019-10-25 NOTE — Telephone Encounter (Signed)
I do not think that would be an issue.

## 2019-10-26 NOTE — Telephone Encounter (Signed)
Pt was advised [facility] 

## 2019-11-23 ENCOUNTER — Encounter: Payer: Self-pay | Admitting: Family Medicine

## 2019-11-23 ENCOUNTER — Ambulatory Visit: Payer: 59 | Admitting: Family Medicine

## 2019-11-23 ENCOUNTER — Other Ambulatory Visit: Payer: Self-pay

## 2019-11-23 VITALS — BP 126/84 | HR 64 | Temp 96.6°F | Wt 259.8 lb

## 2019-11-23 DIAGNOSIS — Z13820 Encounter for screening for osteoporosis: Secondary | ICD-10-CM | POA: Diagnosis not present

## 2019-11-23 DIAGNOSIS — G7000 Myasthenia gravis without (acute) exacerbation: Secondary | ICD-10-CM

## 2019-11-23 DIAGNOSIS — R1013 Epigastric pain: Secondary | ICD-10-CM

## 2019-11-23 DIAGNOSIS — N529 Male erectile dysfunction, unspecified: Secondary | ICD-10-CM

## 2019-11-23 LAB — CBC WITH DIFFERENTIAL/PLATELET
Basophils Absolute: 0.1 10*3/uL (ref 0.0–0.2)
Basos: 1 %
EOS (ABSOLUTE): 0.3 10*3/uL (ref 0.0–0.4)
Eos: 4 %
Hematocrit: 47.9 % (ref 37.5–51.0)
Hemoglobin: 16.7 g/dL (ref 13.0–17.7)
Immature Grans (Abs): 0.1 10*3/uL (ref 0.0–0.1)
Immature Granulocytes: 1 %
Lymphocytes Absolute: 2.5 10*3/uL (ref 0.7–3.1)
Lymphs: 37 %
MCH: 32.9 pg (ref 26.6–33.0)
MCHC: 34.9 g/dL (ref 31.5–35.7)
MCV: 94 fL (ref 79–97)
Monocytes Absolute: 1 10*3/uL — ABNORMAL HIGH (ref 0.1–0.9)
Monocytes: 15 %
Neutrophils Absolute: 2.9 10*3/uL (ref 1.4–7.0)
Neutrophils: 42 %
Platelets: 238 10*3/uL (ref 150–450)
RBC: 5.08 x10E6/uL (ref 4.14–5.80)
RDW: 12.4 % (ref 11.6–15.4)
WBC: 6.7 10*3/uL (ref 3.4–10.8)

## 2019-11-23 LAB — HEMOCCULT GUIAC POC 1CARD (OFFICE): Fecal Occult Blood, POC: NEGATIVE

## 2019-11-23 MED ORDER — PREDNISONE 10 MG PO TABS: ORAL_TABLET | ORAL | 3 refills | Status: DC

## 2019-11-23 MED ORDER — TADALAFIL 20 MG PO TABS: 20.0000 mg | ORAL_TABLET | Freq: Every day | ORAL | 2 refills | Status: DC | PRN

## 2019-11-23 NOTE — Progress Notes (Signed)
   Subjective:    Patient ID: Jay Rosario, male    DOB: Feb 14, 1966, 54 y.o.   MRN: [id number]  HPI He complains of a 2-week history of midepigastric pain that he describes as a pressure-like sensation.  He has been using Aleve fairly regularly but did stop within the last week or so.  He also notes black stool but did use Pepto-Bismol during the same timeframe.  He is not sure exactly when he was using the Pepto in relation to the pain and black stool.  He also has a history of myasthenia gravis and has been on 20 mg of prednisone for a long period of time.  He has tried to taper off of this but has had ocular symptoms because of that.  He needs a refill on his prednisone and would like to get for Cialis renewed.   Review of Systems     Objective:   Physical Exam Alert and in no distress.  Abdominal exam shows active bowel sounds without masses or tenderness stool was brown and guaiac negative       Assessment & Plan:  Midepigastric pain - Plan: CBC with Differential/Platelet, POCT occult blood stool  Myasthenia gravis (HCC) - Plan: predniSONE (DELTASONE) 10 MG tablet  Screening for osteoporosis - Plan: DG Bone Density  Erectile dysfunction, unspecified erectile dysfunction type - Plan: tadalafil (CIALIS) 20 MG tablet I explained that his abdominal pain could be coming from an ulcer although at this time he is having no pain.  The CBC should help.  Cautioned him to avoid all NSAIDs. Because of his continued use of prednisone I think a DEXA scan is reasonable.  He might need a bisphosphonate. Greater than 30 minutes spent in review of his records evaluation and treatment.

## 2019-11-24 ENCOUNTER — Other Ambulatory Visit: Payer: Self-pay | Admitting: Family Medicine

## 2019-11-24 ENCOUNTER — Telehealth: Payer: Self-pay

## 2019-11-24 DIAGNOSIS — Z13820 Encounter for screening for osteoporosis: Secondary | ICD-10-CM

## 2019-11-24 NOTE — Telephone Encounter (Signed)
Poke to rep to advise of the need for bone density . She also advised that they will call

## 2020-02-17 ENCOUNTER — Other Ambulatory Visit: Payer: Self-pay

## 2020-02-17 ENCOUNTER — Ambulatory Visit
Admission: RE | Admit: 2020-02-17 | Discharge: 2020-02-17 | Disposition: A | Discharge status: Home / Self Care | Payer: 59 | Source: Ambulatory Visit | Attending: Family Medicine | Admitting: Family Medicine

## 2020-02-27 ENCOUNTER — Encounter: Payer: 59 | Admitting: Family Medicine

## 2020-02-28 ENCOUNTER — Encounter: Payer: Self-pay | Admitting: Family Medicine

## 2020-10-11 ENCOUNTER — Other Ambulatory Visit: Payer: Self-pay | Admitting: Family Medicine

## 2020-10-11 DIAGNOSIS — N529 Male erectile dysfunction, unspecified: Secondary | ICD-10-CM

## 2020-10-11 NOTE — Telephone Encounter (Signed)
Is this okay to refill? 

## 2021-01-05 ENCOUNTER — Other Ambulatory Visit: Payer: Self-pay | Admitting: Family Medicine

## 2021-01-05 DIAGNOSIS — G7000 Myasthenia gravis without (acute) exacerbation: Secondary | ICD-10-CM

## 2021-01-07 NOTE — Telephone Encounter (Signed)
Received request to refill pts. Prednisone pt. Last apt 11/23/19 and has no future apt.

## 2021-04-28 ENCOUNTER — Emergency Department (HOSPITAL_COMMUNITY): Payer: 59

## 2021-04-28 ENCOUNTER — Inpatient Hospital Stay (HOSPITAL_COMMUNITY)
Admission: EM | Admit: 2021-04-28 | Discharge: 2021-05-04 | DRG: 502 | Disposition: A | Discharge status: Home Health | Payer: 59 | Attending: Internal Medicine | Admitting: Internal Medicine

## 2021-04-28 ENCOUNTER — Other Ambulatory Visit: Payer: Self-pay

## 2021-04-28 ENCOUNTER — Encounter (HOSPITAL_COMMUNITY): Payer: Self-pay | Admitting: Emergency Medicine

## 2021-04-28 DIAGNOSIS — N529 Male erectile dysfunction, unspecified: Secondary | ICD-10-CM | POA: Diagnosis present

## 2021-04-28 DIAGNOSIS — S76112A Strain of left quadriceps muscle, fascia and tendon, initial encounter: Secondary | ICD-10-CM | POA: Diagnosis present

## 2021-04-28 DIAGNOSIS — S76111A Strain of right quadriceps muscle, fascia and tendon, initial encounter: Principal | ICD-10-CM | POA: Diagnosis present

## 2021-04-28 DIAGNOSIS — M791 Myalgia, unspecified site: Secondary | ICD-10-CM

## 2021-04-28 DIAGNOSIS — W19XXXA Unspecified fall, initial encounter: Secondary | ICD-10-CM

## 2021-04-28 DIAGNOSIS — S76119S Strain of unspecified quadriceps muscle, fascia and tendon, sequela: Secondary | ICD-10-CM

## 2021-04-28 DIAGNOSIS — R748 Abnormal levels of other serum enzymes: Secondary | ICD-10-CM | POA: Diagnosis present

## 2021-04-28 DIAGNOSIS — E669 Obesity, unspecified: Secondary | ICD-10-CM | POA: Diagnosis present

## 2021-04-28 DIAGNOSIS — T796XXA Traumatic ischemia of muscle, initial encounter: Secondary | ICD-10-CM | POA: Diagnosis present

## 2021-04-28 DIAGNOSIS — Z8249 Family history of ischemic heart disease and other diseases of the circulatory system: Secondary | ICD-10-CM

## 2021-04-28 DIAGNOSIS — Z7952 Long term (current) use of systemic steroids: Secondary | ICD-10-CM

## 2021-04-28 DIAGNOSIS — S76119A Strain of unspecified quadriceps muscle, fascia and tendon, initial encounter: Secondary | ICD-10-CM

## 2021-04-28 DIAGNOSIS — Z6836 Body mass index (BMI) 36.0-36.9, adult: Secondary | ICD-10-CM

## 2021-04-28 DIAGNOSIS — E785 Hyperlipidemia, unspecified: Secondary | ICD-10-CM | POA: Diagnosis present

## 2021-04-28 DIAGNOSIS — R9431 Abnormal electrocardiogram [ECG] [EKG]: Secondary | ICD-10-CM

## 2021-04-28 DIAGNOSIS — Z20822 Contact with and (suspected) exposure to covid-19: Secondary | ICD-10-CM | POA: Diagnosis present

## 2021-04-28 DIAGNOSIS — W109XXA Fall (on) (from) unspecified stairs and steps, initial encounter: Secondary | ICD-10-CM | POA: Diagnosis present

## 2021-04-28 DIAGNOSIS — Z7901 Long term (current) use of anticoagulants: Secondary | ICD-10-CM

## 2021-04-28 DIAGNOSIS — G7000 Myasthenia gravis without (acute) exacerbation: Secondary | ICD-10-CM

## 2021-04-28 DIAGNOSIS — Z79899 Other long term (current) drug therapy: Secondary | ICD-10-CM

## 2021-04-28 LAB — URINALYSIS, ROUTINE W REFLEX MICROSCOPIC
Bilirubin Urine: NEGATIVE
Glucose, UA: NEGATIVE mg/dL
Hgb urine dipstick: NEGATIVE
Ketones, ur: NEGATIVE mg/dL
Leukocytes,Ua: NEGATIVE
Nitrite: NEGATIVE
Protein, ur: 30 mg/dL — AB
Specific Gravity, Urine: 1.024 (ref 1.005–1.030)
pH: 6 (ref 5.0–8.0)

## 2021-04-28 LAB — COMPREHENSIVE METABOLIC PANEL
ALT: 45 U/L — ABNORMAL HIGH (ref 0–44)
AST: 34 U/L (ref 15–41)
Albumin: 3.4 g/dL — ABNORMAL LOW (ref 3.5–5.0)
Alkaline Phosphatase: 55 U/L (ref 38–126)
Anion gap: 7 (ref 5–15)
BUN: 13 mg/dL (ref 6–20)
CO2: 23 mmol/L (ref 22–32)
Calcium: 8.8 mg/dL — ABNORMAL LOW (ref 8.9–10.3)
Chloride: 106 mmol/L (ref 98–111)
Creatinine, Ser: 1.01 mg/dL (ref 0.61–1.24)
GFR, Estimated: >60 mL/min (ref >60)
Glucose, Bld: 105 mg/dL — ABNORMAL HIGH (ref 70–99)
Potassium: 4 mmol/L (ref 3.5–5.1)
Sodium: 136 mmol/L (ref 135–145)
Total Bilirubin: 0.4 mg/dL (ref 0.3–1.2)
Total Protein: 6.5 g/dL (ref 6.5–8.1)

## 2021-04-28 LAB — CBC
HCT: 47 % (ref 39.0–52.0)
Hemoglobin: 16.2 g/dL (ref 13.0–17.0)
MCH: 32.9 pg (ref 26.0–34.0)
MCHC: 34.5 g/dL (ref 30.0–36.0)
MCV: 95.5 fL (ref 80.0–100.0)
Platelets: 260 10*3/uL (ref 150–400)
RBC: 4.92 MIL/uL (ref 4.22–5.81)
RDW: 13.4 % (ref 11.5–15.5)
WBC: 9.2 10*3/uL (ref 4.0–10.5)
nRBC: 0 % (ref 0.0–0.2)

## 2021-04-28 LAB — LACTIC ACID, PLASMA
Lactic Acid, Venous: 2.1 mmol/L (ref 0.5–1.9)
Lactic Acid, Venous: 1.5 mmol/L (ref 0.5–1.9)

## 2021-04-28 LAB — RESP PANEL BY RT-PCR (FLU A&B, COVID) ARPGX2
Influenza A by PCR: NEGATIVE
Influenza B by PCR: NEGATIVE
SARS Coronavirus 2 by RT PCR: NEGATIVE

## 2021-04-28 LAB — PROTIME-INR
INR: 1 (ref 0.8–1.2)
Prothrombin Time: 13.1 seconds (ref 11.4–15.2)

## 2021-04-28 LAB — CK: Total CK: 645 U/L — ABNORMAL HIGH (ref 49–397)

## 2021-04-28 MED ORDER — HYDROMORPHONE HCL 1 MG/ML IJ SOLN
1.0000 mg | Freq: Once | INTRAMUSCULAR | Status: AC
  Administered 2021-04-28: 1 mg via INTRAVENOUS
  Filled 2021-04-28: qty 1

## 2021-04-28 MED ORDER — ACETAMINOPHEN 325 MG PO TABS
650.0000 mg | ORAL_TABLET | Freq: Once | ORAL | Status: AC
  Administered 2021-04-28: 650 mg via ORAL
  Filled 2021-04-28: qty 2

## 2021-04-28 MED ORDER — HYDROMORPHONE HCL 1 MG/ML IJ SOLN
1.0000 mg | Freq: Once | INTRAMUSCULAR | Status: AC
  Administered 2021-04-29: 1 mg via INTRAVENOUS
  Filled 2021-04-28: qty 1

## 2021-04-28 NOTE — ED Triage Notes (Signed)
BIB EMS called out for fall.  Pt found on his stomach at bottom of steps outside apartments.  Fell down approximately 6-8 steps.  Bilateral thigh pain.  No shortening or rotation. No obvious swelling or deformities.  No neck or back pain. No LOC. No thinners.

## 2021-04-28 NOTE — ED Provider Notes (Signed)
[facility] EMERGENCY DEPARTMENT Provider Note   CSN: [id number] Arrival date & time: 04/28/21  1839     History Chief Complaint  Patient presents with   Jay Rosario is a 55 y.o. male.   Fall Pertinent negatives include no chest pain, no abdominal pain, no headaches and no shortness of breath. Patient presents after a fall.  Fall occurred approximately 45 minutes prior to arrival.  He was walking down steps outside of his apartment when he lost his balance.  He fell down 6 steps.  He states that he felt a cracking sensation in both of his distal thigh areas.  He has since had severe pain in these areas on both legs.  He does not believe that he struck his head.  He denies loss of consciousness.  He does not have any other areas of pain or discomfort other than these areas of his legs.  He denies any history of surgery to the areas of his thighs or knees.  He is not on blood thinners.  His medical history is notable for myasthenia gravis.  This is reportedly well controlled.  Symptoms in the past have been limited to weakness with extraocular movement.  Patient does take a daily prednisone.     Past Medical History:  Diagnosis Date   Dyslipidemia    Myasthenia gravis    Obesity    Positive TB test     Patient Active Problem List   Diagnosis Date Noted   Erectile dysfunction 12/29/2017   Hyperlipidemia with target LDL less than 130 09/06/2012   Obesity (BMI 30-39.9) 04/01/2011   Myasthenia gravis ([place]) 02/20/2011    No past surgical history on file.     Family History  Problem Relation Age of Onset   Hypertension Mother     Social History   Tobacco Use   Smoking status: Never   Smokeless tobacco: Never  Substance Use Topics   Alcohol use: No   Drug use: No    Home Medications Prior to Admission medications   Medication Sig Start Date End Date Taking? Authorizing Provider  diphenhydramine-acetaminophen (TYLENOL PM) 25-500 MG  TABS tablet Take 2 tablets by mouth at bedtime as needed (sleep).   Yes [provider]  predniSONE (DELTASONE) 10 MG tablet TAKE 1 TABLET BY MOUTH TWICE A DAY Patient taking differently: Take 10 mg by mouth daily with breakfast. 01/07/21  Yes Denita Lung, MD  tadalafil (CIALIS) 20 MG tablet TAKE ONE TABLET BY MOUTH DAILY AS NEEDED FOR ERECTILE DYSFUNCTION Patient taking differently: Take 20 mg by mouth daily as needed for erectile dysfunction. 10/11/20  Yes Denita Lung, MD    Allergies    Patient has no known allergies.  Review of Systems   Review of Systems  Constitutional:  Negative for chills, fatigue and fever.  HENT:  Negative for congestion, ear pain and sore throat.   Eyes:  Negative for pain and visual disturbance.  Respiratory:  Positive for cough. Negative for shortness of breath.   Cardiovascular:  Negative for chest pain and palpitations.  Gastrointestinal:  Negative for abdominal pain, diarrhea, nausea and vomiting.  Genitourinary:  Negative for dysuria, flank pain and hematuria.  Musculoskeletal:  Positive for gait problem and myalgias. Negative for arthralgias, back pain, joint swelling, neck pain and neck stiffness.  Skin:  Positive for wound. Negative for color change and rash.  Neurological:  Negative for dizziness, seizures, syncope, weakness, numbness and headaches.  Hematological:  Does not bruise/bleed easily.  Psychiatric/Behavioral:  Negative for confusion and decreased concentration.   All other systems reviewed and are negative.  Physical Exam Updated Vital Signs BP 136/73   Pulse 64   Temp 99.5 F (37.5 C) (Oral)   Resp 17   SpO2 97%   Physical Exam Vitals and nursing note reviewed.  Constitutional:      General: He is not in acute distress.    Appearance: Normal appearance. He is well-developed and normal weight. He is not ill-appearing, toxic-appearing or diaphoretic.  HENT:     Head: Normocephalic and atraumatic.     Left Ear:  External ear normal.     Nose: Nose normal.     Mouth/Throat:     Mouth: Mucous membranes are moist.     Pharynx: Oropharynx is clear.  Eyes:     Extraocular Movements: Extraocular movements intact.     Conjunctiva/sclera: Conjunctivae normal.  Cardiovascular:     Rate and Rhythm: Normal rate and regular rhythm.     Heart sounds: No murmur heard. Pulmonary:     Effort: Pulmonary effort is normal. No respiratory distress.     Breath sounds: Normal breath sounds. No wheezing or rales.  Chest:     Chest wall: No tenderness.  Abdominal:     Palpations: Abdomen is soft.     Tenderness: There is no abdominal tenderness.  Musculoskeletal:        General: Tenderness present. No swelling, deformity or signs of injury.     Cervical back: Normal range of motion and neck supple. No rigidity or tenderness.     Right lower leg: No edema.     Left lower leg: No edema.  Skin:    General: Skin is warm and dry.     Capillary Refill: Capillary refill takes less than 2 seconds.     Coloration: Skin is not jaundiced or pale.  Neurological:     General: No focal deficit present.     Mental Status: He is alert and oriented to person, place, and time.     Cranial Nerves: No cranial nerve deficit.     Sensory: No sensory deficit.     Motor: No weakness.     Coordination: Coordination normal.  Psychiatric:        Mood and Affect: Mood normal.        Behavior: Behavior normal.        Thought Content: Thought content normal.        Judgment: Judgment normal.    ED Results / Procedures / Treatments   Labs (all labs ordered are listed, but only abnormal results are displayed) Labs Reviewed  URINALYSIS, ROUTINE W REFLEX MICROSCOPIC - Abnormal; Notable for the following components:      Result Value   Protein, ur 30 (*)    Bacteria, UA RARE (*)    All other components within normal limits  LACTIC ACID, PLASMA - Abnormal; Notable for the following components:   Lactic Acid, Venous 2.1 (*)    All  other components within normal limits  CK - Abnormal; Notable for the following components:   Total CK 645 (*)    All other components within normal limits  COMPREHENSIVE METABOLIC PANEL - Abnormal; Notable for the following components:   Glucose, Bld 105 (*)    Calcium 8.8 (*)    Albumin 3.4 (*)    ALT 45 (*)    All other components within normal limits  RESP PANEL  BY RT-PCR (FLU A&B, COVID) ARPGX2  CBC  PROTIME-INR  LACTIC ACID, PLASMA  COMPREHENSIVE METABOLIC PANEL  LACTIC ACID, PLASMA  CK    EKG EKG Interpretation  Date/Time:  Sunday April 28 2021 21:01:38 EST Ventricular Rate:  79 PR Interval:  142 QRS Duration: 90 QT Interval:  452 QTC Calculation: 519 R Axis:   79 Text Interpretation: Sinus rhythm Probable left atrial enlargement Prolonged QT interval Confirmed by Godfrey Pick [phone]) on 04/28/2021 10:39:09 PM  Radiology DG Pelvis Portable  Result Date: 04/28/2021 CLINICAL DATA:  [place] [address] downstairs, bilateral thigh pain EXAM: PORTABLE PELVIS 1-2 VIEWS COMPARISON:  None. FINDINGS: Single frontal view of the pelvis was performed. Cassette artifact limits evaluation of the upper pelvis. No acute fractures. Alignment is anatomic. Joint spaces are well preserved. IMPRESSION: 1. No acute bony abnormality. Electronically Signed   By: Randa Ngo M.D.   On: 04/28/2021 20:02   DG Chest Port 1 View  Result Date: 04/28/2021 CLINICAL DATA:  [place], found down EXAM: PORTABLE CHEST 1 VIEW COMPARISON:  05/10/2018 FINDINGS: The heart size and mediastinal contours are within normal limits. Both lungs are clear. The visualized skeletal structures are unremarkable. IMPRESSION: No active disease. Electronically Signed   By: Randa Ngo M.D.   On: 04/28/2021 19:58   DG FEMUR PORT 1V LEFT  Result Date: 04/28/2021 CLINICAL DATA:  [place] [address] down stairs, bilateral 5 pain EXAM: RIGHT FEMUR PORTABLE 1 VIEW; LEFT FEMUR PORTABLE 1 VIEW COMPARISON:  None. FINDINGS: Right femur: 2 frontal views of  the right femur are obtained. There are no acute fractures. Alignment appears grossly anatomic on this single projection. Soft tissues are unremarkable. Left femur: 2 frontal views of the left femur are obtained. There are no acute fractures. Alignment appears grossly anatomic on the single projection. Soft tissues are unremarkable. IMPRESSION: 1. Unremarkable bilateral femurs. Electronically Signed   By: Randa Ngo M.D.   On: 04/28/2021 20:02   DG FEMUR PORT, 1V RIGHT  Result Date: 04/28/2021 CLINICAL DATA:  [place] down stairs, bilateral 5 pain EXAM: RIGHT FEMUR PORTABLE 1 VIEW; LEFT FEMUR PORTABLE 1 VIEW COMPARISON:  None. FINDINGS: Right femur: 2 frontal views of the right femur are obtained. There are no acute fractures. Alignment appears grossly anatomic on this single projection. Soft tissues are unremarkable. Left femur: 2 frontal views of the left femur are obtained. There are no acute fractures. Alignment appears grossly anatomic on the single projection. Soft tissues are unremarkable. IMPRESSION: 1. Unremarkable bilateral femurs. Electronically Signed   By: Randa Ngo M.D.   On: 04/28/2021 20:02    Procedures Procedures   Medications Ordered in ED Medications  HYDROmorphone (DILAUDID) injection 1 mg (has no administration in time range)  HYDROmorphone (DILAUDID) injection 1 mg (1 mg Intravenous Given 04/28/21 1925)  acetaminophen (TYLENOL) tablet 650 mg (650 mg Oral Given 04/28/21 2219)    ED Course  I have reviewed the triage vital signs and the nursing notes.  Pertinent labs & imaging results that were available during my care of the patient were reviewed by me and considered in my medical decision making (see chart for details).    MDM Rules/Calculators/A&P                          Patient presents after a fall.  This fall occurred 45 minutes prior to arrival.  It was while he was walking down steps.  He estimates that he fell down approximately 6 steps.  He  denies  striking his head.  He denies loss of consciousness.  During the fall, he experienced a severe pain in his bilateral distal thighs.  This pain has been persistent.  Currently, pain is 10/10 in severity.  On exam, he does appear to have pain out of proportion.  He has severe tenderness throughout his thigh muscles, greatest in the distal aspects.  Symptoms do appear to be equal bilaterally.  In addition to palpation, pain is worsened with active and passive movements.  He is able to flex and extend his knee, however, these movements are severely limited by pain.  He denies any pain in the area of his knees or distal to his knees.  X-ray imaging studies were ordered.  Patient was given Dilaudid for analgesia.  X-ray images were unremarkable.  On reassessment, patient continues to have severe pain out of proportion.  Interestingly, the areas of the pain and tenderness are warm to the touch.  Temperature was rechecked the patient does have a low-grade fever.  He does endorse recent URI symptoms, however, COVID and flu testing were negative.  Patient was given Tylenol for antipyresis.  Lab work showed a slightly elevated lactic acid (2.1), and an elevated CK (45).  Repeat lactic acid normalized.  Repeat CK was ordered.  Additional pain medications were ordered for persistent pain.  Care of patient was signed out to oncoming ED provider.  Final Clinical Impression(s) / ED Diagnoses Final diagnoses:  Fall  Myalgia    Rx / DC Orders ED Discharge Orders     None        Gloris Manchester, MD 04/29/21 [phone]

## 2021-04-28 NOTE — ED Notes (Signed)
Dr. Durwin Nora made aware via Secure Chat of lactic 2.1. Order received to draw 2nd lactic.

## 2021-04-28 NOTE — ED Notes (Signed)
Lab called with critical value: lactic 2.1. MD notified and Chrislyn, RN.

## 2021-04-29 ENCOUNTER — Emergency Department (HOSPITAL_COMMUNITY): Payer: 59

## 2021-04-29 DIAGNOSIS — S76112A Strain of left quadriceps muscle, fascia and tendon, initial encounter: Secondary | ICD-10-CM | POA: Diagnosis present

## 2021-04-29 DIAGNOSIS — R748 Abnormal levels of other serum enzymes: Secondary | ICD-10-CM | POA: Diagnosis present

## 2021-04-29 DIAGNOSIS — M791 Myalgia, unspecified site: Secondary | ICD-10-CM | POA: Diagnosis present

## 2021-04-29 DIAGNOSIS — R9431 Abnormal electrocardiogram [ECG] [EKG]: Secondary | ICD-10-CM

## 2021-04-29 DIAGNOSIS — N529 Male erectile dysfunction, unspecified: Secondary | ICD-10-CM | POA: Diagnosis present

## 2021-04-29 DIAGNOSIS — Z7901 Long term (current) use of anticoagulants: Secondary | ICD-10-CM | POA: Diagnosis not present

## 2021-04-29 DIAGNOSIS — Z6836 Body mass index (BMI) 36.0-36.9, adult: Secondary | ICD-10-CM | POA: Diagnosis not present

## 2021-04-29 DIAGNOSIS — Z79899 Other long term (current) drug therapy: Secondary | ICD-10-CM | POA: Diagnosis not present

## 2021-04-29 DIAGNOSIS — S76119A Strain of unspecified quadriceps muscle, fascia and tendon, initial encounter: Secondary | ICD-10-CM | POA: Diagnosis not present

## 2021-04-29 DIAGNOSIS — G7000 Myasthenia gravis without (acute) exacerbation: Secondary | ICD-10-CM | POA: Diagnosis present

## 2021-04-29 DIAGNOSIS — E669 Obesity, unspecified: Secondary | ICD-10-CM | POA: Diagnosis present

## 2021-04-29 DIAGNOSIS — Z20822 Contact with and (suspected) exposure to covid-19: Secondary | ICD-10-CM | POA: Diagnosis present

## 2021-04-29 DIAGNOSIS — T796XXA Traumatic ischemia of muscle, initial encounter: Secondary | ICD-10-CM | POA: Diagnosis present

## 2021-04-29 DIAGNOSIS — W109XXA Fall (on) (from) unspecified stairs and steps, initial encounter: Secondary | ICD-10-CM | POA: Diagnosis present

## 2021-04-29 DIAGNOSIS — Z7952 Long term (current) use of systemic steroids: Secondary | ICD-10-CM | POA: Diagnosis not present

## 2021-04-29 DIAGNOSIS — S76111A Strain of right quadriceps muscle, fascia and tendon, initial encounter: Secondary | ICD-10-CM | POA: Diagnosis present

## 2021-04-29 DIAGNOSIS — E785 Hyperlipidemia, unspecified: Secondary | ICD-10-CM | POA: Diagnosis present

## 2021-04-29 DIAGNOSIS — Z8249 Family history of ischemic heart disease and other diseases of the circulatory system: Secondary | ICD-10-CM | POA: Diagnosis not present

## 2021-04-29 DIAGNOSIS — S76119D Strain of unspecified quadriceps muscle, fascia and tendon, subsequent encounter: Secondary | ICD-10-CM | POA: Diagnosis not present

## 2021-04-29 LAB — MAGNESIUM: Magnesium: 2 mg/dL (ref 1.7–2.4)

## 2021-04-29 LAB — HIV ANTIBODY (ROUTINE TESTING W REFLEX): HIV Screen 4th Generation wRfx: NONREACTIVE

## 2021-04-29 LAB — CK: Total CK: 656 U/L — ABNORMAL HIGH (ref 49–397)

## 2021-04-29 LAB — LACTIC ACID, PLASMA: Lactic Acid, Venous: 1.8 mmol/L (ref 0.5–1.9)

## 2021-04-29 MED ORDER — HYDROMORPHONE HCL 1 MG/ML IJ SOLN
1.0000 mg | Freq: Once | INTRAMUSCULAR | Status: AC
  Administered 2021-04-29: 1 mg via INTRAVENOUS
  Filled 2021-04-29: qty 1

## 2021-04-29 MED ORDER — HYDROCODONE-ACETAMINOPHEN 5-325 MG PO TABS
1.0000 | ORAL_TABLET | ORAL | Status: DC | PRN
Start: 2021-04-29 — End: 2021-04-30

## 2021-04-29 MED ORDER — HYDROMORPHONE HCL 1 MG/ML IJ SOLN
0.5000 mg | INTRAMUSCULAR | Status: DC | PRN
  Administered 2021-04-29 – 2021-04-30 (×3): 1 mg via INTRAVENOUS
  Filled 2021-04-29 (×3): qty 1

## 2021-04-29 MED ORDER — SODIUM CHLORIDE 0.9 % IV BOLUS
500.0000 mL | Freq: Once | INTRAVENOUS | Status: AC
  Administered 2021-04-29: 500 mL via INTRAVENOUS

## 2021-04-29 MED ORDER — ACETAMINOPHEN 325 MG PO TABS
650.0000 mg | ORAL_TABLET | Freq: Four times a day (QID) | ORAL | Status: DC | PRN
  Administered 2021-04-30: 1000 mg via ORAL

## 2021-04-29 MED ORDER — PREDNISONE 10 MG PO TABS
10.0000 mg | ORAL_TABLET | Freq: Every day | ORAL | Status: DC
  Administered 2021-04-29 – 2021-05-04 (×5): 10 mg via ORAL
  Filled 2021-04-29 (×5): qty 1

## 2021-04-29 MED ORDER — ONDANSETRON HCL 4 MG/2ML IJ SOLN
4.0000 mg | Freq: Once | INTRAMUSCULAR | Status: AC
  Administered 2021-04-29: 4 mg via INTRAVENOUS
  Filled 2021-04-29: qty 2

## 2021-04-29 MED ORDER — KETOROLAC TROMETHAMINE 15 MG/ML IJ SOLN
15.0000 mg | Freq: Once | INTRAMUSCULAR | Status: AC
  Administered 2021-04-29: 15 mg via INTRAVENOUS
  Filled 2021-04-29: qty 1

## 2021-04-29 MED ORDER — SODIUM CHLORIDE 0.9 % IV SOLN: INTRAVENOUS | Status: DC

## 2021-04-29 MED ORDER — MORPHINE SULFATE (PF) 4 MG/ML IV SOLN
4.0000 mg | Freq: Once | INTRAVENOUS | Status: AC
  Administered 2021-04-29: 4 mg via INTRAVENOUS
  Filled 2021-04-29: qty 1

## 2021-04-29 NOTE — H&P (Signed)
History and Physical    Jay Rosario ZOX:[id number] DOB: [birth year] DOA: 04/28/2021  PCP: Ronnald Nian, MD Consultants:  none Patient coming from:  Home - lives with his wife and two boys   Chief Complaint: mechanical fall with knee pain   HPI: Jay Rosario is a 55 y.o. male with medical history significant of Myasthenia gravis on chronic steroids, HLD presenting to ED with mechanical fall and subsequent bilateral knee pain. He was at a friends house and walking down some steps looking at his phone. He thought he was done with the steps and apparently fell stepping down. He doesn't remember how he fell, but states it was the worse pain of his life. Pain was 10/10 and felt like someone was tearing his legs apart. No radiation. No loss of sensation down the legs. He was immediately unable to bear weight and his wife called 911. He can move the legs side to side, but that is all. He continues to have excruciating pain.    No fever/chills, headaches/vision changes, chest pain or palpitations, no shortness of breath. He has had a mild cough a week ago, but is better and is dry in nature, no stomach pain/N/V/D, no leg swelling or dysuria.   He has a history of MG that he is not followed by neurology any longer. Has history of flairs in the past, but states it only affects his eyes. He denies any visual changes, double vision, blurry vision or ptosis.   ED Course: vitals: afebrile later on tmax of 100.7, bp: 137/85, HR: 73, RR: 20, oxygen 98% RA Pertinent labs: lactic acid 2.1>1.5, CK: 645, UA: 30 protein,  MRI left knee: high grade complete or near complete rupture of the distal quadriceps tendon. Intrasubstance degeneration of the medial meniscus without discrete tear.  MRI right knee: high grade, near complete tear of the quadriceps tendon at the patellar insertion. Small joint effusion and extensive adjacent soft tissue swelling. Nondisplaced horizontal tear of the anterior horn and  body of the lateral meniscus with adjacent parameniscal cyst formation. In ED: given pain medication, ortho was consulted and [facility] was asked to admit.   Review of Systems: As per HPI; otherwise review of systems reviewed and negative.   Ambulatory Status:  Ambulates without assistance  Past Medical History:  Diagnosis Date   Dyslipidemia    Myasthenia gravis    Obesity    Positive TB test     No past surgical history on file.  Social History   Socioeconomic History   Marital status: Married    Spouse name: Not on file   Number of children: Not on file   Years of education: Not on file   Highest education level: Not on file  Occupational History   Not on file  Tobacco Use   Smoking status: Never   Smokeless tobacco: Never  Substance and Sexual Activity   Alcohol use: No   Drug use: No   Sexual activity: Yes    Birth control/protection: Surgical  Other Topics Concern   Not on file  Social History Narrative   Not on file   Social Determinants of Health   Financial Resource Strain: Not on file  Food Insecurity: Not on file  Transportation Needs: Not on file  Physical Activity: Not on file  Stress: Not on file  Social Connections: Not on file  Intimate Partner Violence: Not on file    No Known Allergies  Family History  Problem Relation Age of  Onset   Hypertension Mother     Prior to Admission medications   Medication Sig Start Date End Date Taking? Authorizing Provider  diphenhydramine-acetaminophen (TYLENOL PM) 25-500 MG TABS tablet Take 2 tablets by mouth at bedtime as needed (sleep).   Yes [provider]  predniSONE (DELTASONE) 10 MG tablet TAKE 1 TABLET BY MOUTH TWICE A DAY Patient taking differently: Take 10 mg by mouth daily with breakfast. 01/07/21  Yes Ronnald Nian, MD  tadalafil (CIALIS) 20 MG tablet TAKE ONE TABLET BY MOUTH DAILY AS NEEDED FOR ERECTILE DYSFUNCTION Patient taking differently: Take 20 mg by mouth daily as needed for  erectile dysfunction. 10/11/20  Yes Ronnald Nian, MD    Physical Exam: Vitals:   04/29/21 1030 04/29/21 1031 04/29/21 1130 04/29/21 1215  BP: 90/81 124/77 124/76 127/76  Pulse: 69 64 64 75  Resp: (!) 22 (!) 22 11 18   Temp:      TempSrc:      SpO2: 98% 98% 98% 96%     General:  Appears calm and comfortable and is in NAD Eyes:  PERRL, EOMI, normal lids, iris ENT:  grossly normal hearing, lips & tongue, mmm; upper/lower dentures Neck:  no LAD, masses or thyromegaly; no carotid bruits Cardiovascular:  RRR, no m/r/g. No LE edema.  Respiratory:   CTA bilaterally with no wheezes/rales/rhonchi.  Normal respiratory effort. Abdomen:  soft, NT, ND, NABS Back:   normal alignment, no CVAT Skin:  no rash or induration seen on limited exam. Superficial abrasions to left forearm. No open wounds to knees.  Musculoskeletal:  bilateral legs in knee immobilizers. Can move legs side to side. Sensation intact at feet. Pedal pulses intact bilaterally.  Lower extremity:  No LE edema.  Limited foot exam with no ulcerations.  2+ distal pulses. Psychiatric:  grossly normal mood and affect, speech fluent and appropriate, AOx3 Neurologic:  CN 2-12 grossly intact, moves all extremities in coordinated fashion, sensation intact    Radiological Exams on Admission: Independently reviewed - see discussion in A/P where applicable  DG Pelvis Portable  Result Date: 04/28/2021 CLINICAL DATA:  14/03/2021 downstairs, bilateral thigh pain EXAM: PORTABLE PELVIS 1-2 VIEWS COMPARISON:  None. FINDINGS: Single frontal view of the pelvis was performed. Cassette artifact limits evaluation of the upper pelvis. No acute fractures. Alignment is anatomic. Joint spaces are well preserved. IMPRESSION: 1. No acute bony abnormality. Electronically Signed   By: Larey Seat M.D.   On: 04/28/2021 20:02   MR KNEE RIGHT WO CONTRAST  Result Date: 04/29/2021 CLINICAL DATA:  suspect quadriceps tendon rupture EXAM: MRI OF THE RIGHT KNEE  WITHOUT CONTRAST TECHNIQUE: Multiplanar, multisequence MR imaging of the knee was performed. No intravenous contrast was administered. COMPARISON:  None. FINDINGS: MENISCI Medial: Intrasubstance degenerative signal without definitive meniscus tear. Adjacent parameniscal cyst formation measuring 2.5 x 1.4 x 3.5 cm (coronal T2 image 14, axial T2 image 13). Lateral: Nondisplaced horizontal tear of the anterior horn and body of the lateral meniscus. LIGAMENTS Cruciates: ACL and PCL are intact. Collaterals: Medial collateral ligament is intact. Lateral collateral ligament complex is intact. CARTILAGE Patellofemoral:  Mild chondrosis. Medial:  Mild chondrosis. Lateral:  Mild chondrosis. JOINT: Small joint effusion. POPLITEAL FOSSA: Miniscule Baker cyst. EXTENSOR MECHANISM: There is a high-grade, near complete tear of the quadriceps tendon at the patellar insertion. The patellar tendon is intact. The retinacula are intact. BONES: No acute fracture or dislocation. No aggressive osseous lesion. Tricompartment osteophyte formation. Extensive soft tissue swelling along the suprapatellar knee. Other:  No fluid collection or hematoma. Muscles are normal. IMPRESSION: High-grade, near complete tear of the quadriceps tendon at the patellar insertion. Small joint effusion and extensive adjacent soft tissue swelling. Nondisplaced horizontal tear of the anterior horn and body of the lateral meniscus with adjacent parameniscal cyst formation. Mild tricompartment osteoarthritis. Electronically Signed   By: Caprice Renshaw M.D.   On: 04/29/2021 10:01   MR KNEE LEFT WO CONTRAST  Result Date: 04/29/2021 CLINICAL DATA:  Fall, thigh pain. Suspected quadriceps tendon rupture EXAM: MRI OF THE LEFT KNEE WITHOUT CONTRAST TECHNIQUE: Multiplanar, multisequence MR imaging of the knee was performed. No intravenous contrast was administered. COMPARISON:  AP left femur radiograph 04/28/2021 FINDINGS: MENISCI Medial meniscus:  Intrasubstance  degeneration without discrete tear. Lateral meniscus:  Intact. LIGAMENTS Cruciates: Intact ACL with tiny intrasubstance ganglion cyst. Intact PCL. Collaterals: Medial collateral ligament is intact. Lateral collateral ligament complex is intact. CARTILAGE Patellofemoral: Mild chondral thinning and surface irregularity along the lateral patellar facet. Medial: Mild chondral surface irregularity along the lateral margin of the weight-bearing medial femoral condyle. Lateral:  No chondral defect. Joint: Small joint effusion. Edematous appearance of the suprapatellar fat pad. Hoffa's fat is normal in appearance. Popliteal Fossa:  No Baker cyst. Intact popliteus tendon. Extensor Mechanism: High-grade complete or near complete rupture of the distal quadriceps tendon, particularly involving the central and medial aspects of the distal tendon at its patellar attachment. Intact patellar tendon. Bones: No acute fracture. No dislocation. Mild tricompartmental joint space narrowing. Other: Soft tissue edema and ill-defined fluid at the anterior aspect of the knee surrounding the quadriceps tendon rupture site. IMPRESSION: Left knee: 1. High-grade complete or near-complete rupture of the distal quadriceps tendon. 2. Small joint effusion. 3. Intrasubstance degeneration of the medial meniscus without discrete tear. 4. Intact ACL with tiny intrasubstance ganglion cyst. Electronically Signed   By: Duanne Guess D.O.   On: 04/29/2021 10:10   DG Chest Port 1 View  Result Date: 04/28/2021 CLINICAL DATA:  Larey Seat, found down EXAM: PORTABLE CHEST 1 VIEW COMPARISON:  05/10/2018 FINDINGS: The heart size and mediastinal contours are within normal limits. Both lungs are clear. The visualized skeletal structures are unremarkable. IMPRESSION: No active disease. Electronically Signed   By: Sharlet Salina M.D.   On: 04/28/2021 19:58   DG FEMUR PORT 1V LEFT  Result Date: 04/28/2021 CLINICAL DATA:  Larey Seat down stairs, bilateral 5 pain EXAM:  RIGHT FEMUR PORTABLE 1 VIEW; LEFT FEMUR PORTABLE 1 VIEW COMPARISON:  None. FINDINGS: Right femur: 2 frontal views of the right femur are obtained. There are no acute fractures. Alignment appears grossly anatomic on this single projection. Soft tissues are unremarkable. Left femur: 2 frontal views of the left femur are obtained. There are no acute fractures. Alignment appears grossly anatomic on the single projection. Soft tissues are unremarkable. IMPRESSION: 1. Unremarkable bilateral femurs. Electronically Signed   By: Sharlet Salina M.D.   On: 04/28/2021 20:02   DG FEMUR PORT, 1V RIGHT  Result Date: 04/28/2021 CLINICAL DATA:  Larey Seat down stairs, bilateral 5 pain EXAM: RIGHT FEMUR PORTABLE 1 VIEW; LEFT FEMUR PORTABLE 1 VIEW COMPARISON:  None. FINDINGS: Right femur: 2 frontal views of the right femur are obtained. There are no acute fractures. Alignment appears grossly anatomic on this single projection. Soft tissues are unremarkable. Left femur: 2 frontal views of the left femur are obtained. There are no acute fractures. Alignment appears grossly anatomic on the single projection. Soft tissues are unremarkable. IMPRESSION: 1. Unremarkable bilateral femurs. Electronically Signed  By: Sharlet Salina M.D.   On: 04/28/2021 20:02    EKG: Independently reviewed.  NSR with rate 79; nonspecific ST changes with no evidence of acute ischemia, prolonged QT   Labs on Admission: I have personally reviewed the available labs and imaging studies at the time of the admission.  Pertinent labs:  lactic acid 2.1>1.5,  CK: 645,  UA: 30 protein,    Assessment/Plan Principal Problem:  bilateral Quadriceps muscle rupture 55 year old male presenting to ED after mechanical fall with subsequent bilateral knee pain and inability to bear weight found to have bilateral ruptured quadriceps -admit to medical telemetry -ortho consulted, likely OR tomorrow. Knee immobilizers bilaterally  -pain control with norco and  dilaudid -IVF fluids -plan per ortho  Active Problems:   Elevated CK Acquired due to trauma from mechanical fall  CK to 656 secondary to above. Asked ED to give bolus and will put him on maintenance IVF at 100cc/hour. Repeat CK in AM No AKI, electrolytes wnl. UA with no hgb Had tmax to 100.7 which could be secondary to this. No white count or other signs of infection. Follow fever curve, trend cbc/CK     Prolonged QT interval New since ekg in 2019, possibly secondary to elevated CK Check magnesium, other electrolytes wnl Telemetry x 24 hours Repeat ekg in AM Avoid qt prolonging drugs    Myasthenia gravis (HCC) No signs of exacerbation at this time.  Continue daily prednisone at /daily    Erectile dysfunction Hold cialis while inpatient     Hyperlipidemia with target LDL less than 130 Diet controlled       There is no height or weight on file to calculate BMI.   Level of care: Telemetry Medical DVT prophylaxis:  TED hose  Code Status:  Full - confirmed with patient/family Family Communication: wife at bedside: Venia Carbon  Disposition Plan:  The patient is from: home  Anticipated d/c is to: per day team    Requires inpatient hospitalization and is at significant risk of worsening, requires constant monitoring, assessment/intervention, IV pain control and MDM with specialists.    Patient is currently: stable Consults called: orthopedics   Admission status:  inpatient    Orland Mustard MD [facility]   How to contact the [facility] Attending or Consulting provider 7A - 7P or covering provider during after hours 7P -7A, for this patient?  Check the care team in [facility] and look for a) attending/consulting [facility] provider listed and b) the [facility] team listed Log into [email or web address] and use [place]'s universal password to access. If you do not have the password, please contact the hospital operator. Locate the [facility] provider you are looking for under [facility]  and page to a number that you can be directly reached. If you still have difficulty reaching the provider, please page the [facility] (Director on Call) for the Hospitalists listed on amion for assistance.   04/29/2021, 1:35 PM

## 2021-04-29 NOTE — ED Provider Notes (Signed)
Patient signed out to me by Dr. Durwin Nora to disposition.  Patient seen earlier tonight after a fall.  Patient is not sure what caused him to fall, he may have been distracted looking at his phone.  He reports that he fell to the ground with his legs "twisted up".  He has been having severe pain just above both of his knees ever since the fall.  He cannot get up or move around because of this pain.  On examination he does have a fullness and tenderness at the distal quadriceps region.  I am concerned that there may be a defect present.  He is unable to lift his legs off the bed because of pain.  When I manually lift his legs with the knee extended, he cannot keep the leg extended on his own against gravity.  He will therefore undergo MRI of his knees to rule out quadricep tendon rupture.  Patient provided additional analgesia.  Will signout to oncoming ER physician because MRI cannot be performed before shift change.   Gilda Crease, MD 04/29/21 [phone]

## 2021-04-29 NOTE — ED Notes (Signed)
Pt in MRI at this time 

## 2021-04-29 NOTE — ED Provider Notes (Signed)
Patient signed out to me by previous provider. Please refer to their note for full HPI.  Briefly this is a 55 year old male status post mechanical fall with bilateral lower thigh pain.  He does have palpable deformities on exam with significant discomfort.  We will attempt to manage pain and he is pending MRI of the bilateral knees as he is unable to weight-bear, extend or move his legs. Physical Exam  BP 124/77   Pulse 64   Temp 99.2 F (37.3 C) (Oral)   Resp (!) 22   SpO2 98%   Physical Exam Vitals and nursing note reviewed.  Constitutional:      Appearance: Normal appearance.  HENT:     Head: Normocephalic.     Mouth/Throat:     Mouth: Mucous membranes are moist.  Cardiovascular:     Rate and Rhythm: Normal rate.  Pulmonary:     Effort: Pulmonary effort is normal. No respiratory distress.  Abdominal:     Palpations: Abdomen is soft.  Musculoskeletal:     Comments: Bogginess and tenderness to palpation just above the patellas bilaterally, patient is unable to mobilize, extend the knees, equal peripheral cap refill  Skin:    General: Skin is warm.  Neurological:     Mental Status: He is alert and oriented to person, place, and time. Mental status is at baseline.  Psychiatric:        Mood and Affect: Mood normal.    ED Course/Procedures     Procedures  MDM   MRIs confirmed bilateral high-grade/near complete tears of the quadriceps tendon.  This correlates with the patient's physical exam findings and symptoms.  We will place him in bilateral knee immobilizers, Earney Hamburg orthopedic PA consulted who will see the patient.  Patient will be admitted to the medical service given his comorbidities.  Currently pain is controlled, lower extremities are vascularly intact.  Patients evaluation and results requires admission for further treatment and care. Patient agrees with admission plan, offers no new complaints and is stable/unchanged at time of admit.       Rozelle Logan, DO 04/29/21 1126

## 2021-04-29 NOTE — Consult Note (Signed)
Reason for Consult:Bilateral quad rupture Referring Physician: Coralee Pesa Time called: 1121 Time at bedside: 1130   Jay Rosario is an 55 y.o. male.  HPI: Jay Rosario was coming down some steps when he tripped and fell down 6-7 of them. He had immediate bilateral knee pain and could not get up or bear weight. He was brought to the ED where workup showed bilateral complete quad ruptures and orthopedic surgery was consulted. He works in [employer], hospital.  Past Medical History:  Diagnosis Date   Dyslipidemia    Myasthenia gravis    Obesity    Positive TB test     No past surgical history on file.  Family History  Problem Relation Age of Onset   Hypertension Mother     Social History:  reports that he has never smoked. He has never used smokeless tobacco. He reports that he does not drink alcohol and does not use drugs.  Allergies: No Known Allergies  Medications: I have reviewed the patient's current medications.  Results for orders placed or performed during the hospital encounter of 04/28/21 (from the past 48 hour(s))  CBC     Status: None   Collection Time: 04/28/21  6:50 PM  Result Value Ref Range   WBC 9.2 4.0 - 10.5 K/uL   RBC 4.92 4.22 - 5.81 MIL/uL   Hemoglobin 16.2 13.0 - 17.0 g/dL   HCT 21.3 08.6 - 57.8 %   MCV 95.5 80.0 - 100.0 fL   MCH 32.9 26.0 - 34.0 pg   MCHC 34.5 30.0 - 36.0 g/dL   RDW 46.9 62.9 - 52.8 %   Platelets 260 150 - 400 K/uL   nRBC 0.0 0.0 - 0.2 %    Comment: Performed at [facility], [address] [place], [place] [address]  Lactic acid, plasma     Status: Abnormal   Collection Time: 04/28/21  9:00 PM  Result Value Ref Range   Lactic Acid, Venous 2.1 (HH) 0.5 - 1.9 mmol/L    Comment: CRITICAL RESULT CALLED TO, READ BACK BY AND VERIFIED WITH: Ivery Quale RN 04/28/21 2228 En[place] Derry Performed at [facility], [address] [place], [place] [address]   Protime-INR     Status: None   Collection Time: 04/28/21  9:00  PM  Result Value Ref Range   Prothrombin Time 13.1 11.4 - 15.2 seconds   INR 1.0 0.8 - 1.2    Comment: (NOTE) INR goal varies based on device and disease states. Performed at [facility], [address], [place], [place] [address]   Resp Panel by RT-PCR (Flu A&B, Covid) Nasopharyngeal Swab     Status: None   Collection Time: 04/28/21  9:04 PM   Specimen: Nasopharyngeal Swab; Nasopharyngeal(NP) swabs in vial transport medium  Result Value Ref Range   SARS Coronavirus 2 by RT PCR NEGATIVE NEGATIVE    Comment: (NOTE) SARS-CoV-2 target nucleic acids are NOT DETECTED.  The SARS-CoV-2 RNA is generally detectable in upper respiratory specimens during the acute phase of infection. The lowest concentration of SARS-CoV-2 viral copies this assay can detect is 138 copies/mL. A negative result does not preclude SARS-Cov-2 infection and should not be used as the sole basis for treatment or other patient management decisions. A negative result may occur with  improper specimen collection/handling, submission of specimen other than nasopharyngeal swab, presence of viral mutation(s) within the areas targeted by this assay, and inadequate number of viral copies(<138 copies/mL). A negative result  must be combined with clinical observations, patient history, and epidemiological information. The expected result is Negative.  Fact Sheet for Patients:  BloggerCourse.com  Fact Sheet for Healthcare Providers:  SeriousBroker.it  This test is no t yet approved or cleared by the [place] FDA and  has been authorized for detection and/or diagnosis of SARS-CoV-2 by FDA under an Emergency Use Authorization (EUA). This EUA will remain  in effect (meaning this test can be used) for the duration of the COVID-19 declaration under Section 564(b)(1) of the Act, 21 U.S.C.section 360bbb-3(b)(1), unless the authorization is terminated  or revoked sooner.        Influenza A by PCR NEGATIVE NEGATIVE   Influenza B by PCR NEGATIVE NEGATIVE    Comment: (NOTE) The Xpert Xpress SARS-CoV-2/FLU/RSV plus assay is intended as an aid in the diagnosis of influenza from Nasopharyngeal swab specimens and should not be used as a sole basis for treatment. Nasal washings and aspirates are unacceptable for Xpert Xpress SARS-CoV-2/FLU/RSV testing.  Fact Sheet for Patients: BloggerCourse.com  Fact Sheet for Healthcare Providers: SeriousBroker.it  This test is not yet approved or cleared by the [place] FDA and has been authorized for detection and/or diagnosis of SARS-CoV-2 by FDA under an Emergency Use Authorization (EUA). This EUA will remain in effect (meaning this test can be used) for the duration of the COVID-19 declaration under Section 564(b)(1) of the Act, 21 U.S.C. section 360bbb-3(b)(1), unless the authorization is terminated or revoked.  Performed at [facility], [address] [place], [place] [address]   Urinalysis, Routine w reflex microscopic Nasopharyngeal Swab     Status: Abnormal   Collection Time: 04/28/21  9:11 PM  Result Value Ref Range   Color, Urine YELLOW YELLOW   APPearance CLEAR CLEAR   Specific Gravity, Urine 1.024 1.005 - 1.030   pH 6.0 5.0 - 8.0   Glucose, UA NEGATIVE NEGATIVE mg/dL   Hgb urine dipstick NEGATIVE NEGATIVE   Bilirubin Urine NEGATIVE NEGATIVE   Ketones, ur NEGATIVE NEGATIVE mg/dL   Protein, ur 30 (A) NEGATIVE mg/dL   Nitrite NEGATIVE NEGATIVE   Leukocytes,Ua NEGATIVE NEGATIVE   RBC / HPF 0-5 0 - 5 RBC/hpf   WBC, UA 6-10 0 - 5 WBC/hpf   Bacteria, UA RARE (A) NONE SEEN   Mucus PRESENT     Comment: Performed at [facility], [address], [place], [place] [address]  Comprehensive metabolic panel     Status: Abnormal   Collection Time: 04/28/21 10:27 PM  Result Value Ref Range   Sodium 136 135 - 145 mmol/L   Potassium 4.0 3.5 -  5.1 mmol/L   Chloride 106 98 - 111 mmol/L   CO2 23 22 - 32 mmol/L   Glucose, Bld 105 (H) 70 - 99 mg/dL    Comment: Glucose reference range applies only to samples taken after fasting for at least 8 hours.   BUN 13 6 - 20 mg/dL   Creatinine, Ser 8.29 0.61 - 1.24 mg/dL   Calcium 8.8 (L) 8.9 - 10.3 mg/dL   Total Protein 6.5 6.5 - 8.1 g/dL   Albumin 3.4 (L) 3.5 - 5.0 g/dL   AST 34 15 - 41 U/L   ALT 45 (H) 0 - 44 U/L   Alkaline Phosphatase 55 38 - 126 U/L   Total Bilirubin 0.4 0.3 - 1.2 mg/dL   GFR, Estimated >56 >21 mL/min    Comment: (NOTE) Calculated using the CKD-EPI Creatinine Equation (2021)  Anion gap 7 5 - 15    Comment: Performed at [facility], [address] [place], [place] [address]  CK     Status: Abnormal   Collection Time: 04/28/21 10:38 PM  Result Value Ref Range   Total CK 645 (H) 49 - 397 U/L    Comment: Performed at [facility], [address] [place], [place] [address]  Lactic acid, plasma     Status: None   Collection Time: 04/28/21 10:45 PM  Result Value Ref Range   Lactic Acid, Venous 1.5 0.5 - 1.9 mmol/L    Comment: Performed at [facility], [address] [place], [place] [address]  Lactic acid, plasma     Status: None   Collection Time: 04/29/21 12:45 AM  Result Value Ref Range   Lactic Acid, Venous 1.8 0.5 - 1.9 mmol/L    Comment: Performed at [facility], [address], [place], [place] [address]  CK     Status: Abnormal   Collection Time: 04/29/21  1:27 AM  Result Value Ref Range   Total CK 656 (H) 49 - 397 U/L    Comment: Performed at [facility], [address] [place], [place] [address]    DG Pelvis Portable  Result Date: 04/28/2021 CLINICAL DATA:  Larey Seat downstairs, bilateral thigh pain EXAM: PORTABLE PELVIS 1-2 VIEWS COMPARISON:  None. FINDINGS: Single frontal view of the pelvis was performed. Cassette artifact limits evaluation of the upper pelvis. No acute fractures. Alignment is anatomic. Joint  spaces are well preserved. IMPRESSION: 1. No acute bony abnormality. Electronically Signed   By: Sharlet Salina M.D.   On: 04/28/2021 20:02   MR KNEE RIGHT WO CONTRAST  Result Date: 04/29/2021 CLINICAL DATA:  suspect quadriceps tendon rupture EXAM: MRI OF THE RIGHT KNEE WITHOUT CONTRAST TECHNIQUE: Multiplanar, multisequence MR imaging of the knee was performed. No intravenous contrast was administered. COMPARISON:  None. FINDINGS: MENISCI Medial: Intrasubstance degenerative signal without definitive meniscus tear. Adjacent parameniscal cyst formation measuring 2.5 x 1.4 x 3.5 cm (coronal T2 image 14, axial T2 image 13). Lateral: Nondisplaced horizontal tear of the anterior horn and body of the lateral meniscus. LIGAMENTS Cruciates: ACL and PCL are intact. Collaterals: Medial collateral ligament is intact. Lateral collateral ligament complex is intact. CARTILAGE Patellofemoral:  Mild chondrosis. Medial:  Mild chondrosis. Lateral:  Mild chondrosis. JOINT: Small joint effusion. POPLITEAL FOSSA: Miniscule Baker cyst. EXTENSOR MECHANISM: There is a high-grade, near complete tear of the quadriceps tendon at the patellar insertion. The patellar tendon is intact. The retinacula are intact. BONES: No acute fracture or dislocation. No aggressive osseous lesion. Tricompartment osteophyte formation. Extensive soft tissue swelling along the suprapatellar knee. Other: No fluid collection or hematoma. Muscles are normal. IMPRESSION: High-grade, near complete tear of the quadriceps tendon at the patellar insertion. Small joint effusion and extensive adjacent soft tissue swelling. Nondisplaced horizontal tear of the anterior horn and body of the lateral meniscus with adjacent parameniscal cyst formation. Mild tricompartment osteoarthritis. Electronically Signed   By: Caprice Renshaw M.D.   On: 04/29/2021 10:01   MR KNEE LEFT WO CONTRAST  Result Date: 04/29/2021 CLINICAL DATA:  Fall, thigh pain. Suspected quadriceps tendon  rupture EXAM: MRI OF THE LEFT KNEE WITHOUT CONTRAST TECHNIQUE: Multiplanar, multisequence MR imaging of the knee was performed. No intravenous contrast was administered. COMPARISON:  AP left femur radiograph 04/28/2021 FINDINGS: MENISCI Medial meniscus:  Intrasubstance degeneration without discrete tear. Lateral meniscus:  Intact. LIGAMENTS Cruciates:  Intact ACL with tiny intrasubstance ganglion cyst. Intact PCL. Collaterals: Medial collateral ligament is intact. Lateral collateral ligament complex is intact. CARTILAGE Patellofemoral: Mild chondral thinning and surface irregularity along the lateral patellar facet. Medial: Mild chondral surface irregularity along the lateral margin of the weight-bearing medial femoral condyle. Lateral:  No chondral defect. Joint: Small joint effusion. Edematous appearance of the suprapatellar fat pad. Hoffa's fat is normal in appearance. Popliteal Fossa:  No Baker cyst. Intact popliteus tendon. Extensor Mechanism: High-grade complete or near complete rupture of the distal quadriceps tendon, particularly involving the central and medial aspects of the distal tendon at its patellar attachment. Intact patellar tendon. Bones: No acute fracture. No dislocation. Mild tricompartmental joint space narrowing. Other: Soft tissue edema and ill-defined fluid at the anterior aspect of the knee surrounding the quadriceps tendon rupture site. IMPRESSION: Left knee: 1. High-grade complete or near-complete rupture of the distal quadriceps tendon. 2. Small joint effusion. 3. Intrasubstance degeneration of the medial meniscus without discrete tear. 4. Intact ACL with tiny intrasubstance ganglion cyst. Electronically Signed   By: Duanne Guess D.O.   On: 04/29/2021 10:10   DG Chest Port 1 View  Result Date: 04/28/2021 CLINICAL DATA:  Larey Seat, found down EXAM: PORTABLE CHEST 1 VIEW COMPARISON:  05/10/2018 FINDINGS: The heart size and mediastinal contours are within normal limits. Both lungs are  clear. The visualized skeletal structures are unremarkable. IMPRESSION: No active disease. Electronically Signed   By: Sharlet Salina M.D.   On: 04/28/2021 19:58   DG FEMUR PORT 1V LEFT  Result Date: 04/28/2021 CLINICAL DATA:  Larey Seat down stairs, bilateral 5 pain EXAM: RIGHT FEMUR PORTABLE 1 VIEW; LEFT FEMUR PORTABLE 1 VIEW COMPARISON:  None. FINDINGS: Right femur: 2 frontal views of the right femur are obtained. There are no acute fractures. Alignment appears grossly anatomic on this single projection. Soft tissues are unremarkable. Left femur: 2 frontal views of the left femur are obtained. There are no acute fractures. Alignment appears grossly anatomic on the single projection. Soft tissues are unremarkable. IMPRESSION: 1. Unremarkable bilateral femurs. Electronically Signed   By: Sharlet Salina M.D.   On: 04/28/2021 20:02   DG FEMUR PORT, 1V RIGHT  Result Date: 04/28/2021 CLINICAL DATA:  Larey Seat down stairs, bilateral 5 pain EXAM: RIGHT FEMUR PORTABLE 1 VIEW; LEFT FEMUR PORTABLE 1 VIEW COMPARISON:  None. FINDINGS: Right femur: 2 frontal views of the right femur are obtained. There are no acute fractures. Alignment appears grossly anatomic on this single projection. Soft tissues are unremarkable. Left femur: 2 frontal views of the left femur are obtained. There are no acute fractures. Alignment appears grossly anatomic on the single projection. Soft tissues are unremarkable. IMPRESSION: 1. Unremarkable bilateral femurs. Electronically Signed   By: Sharlet Salina M.D.   On: 04/28/2021 20:02    Review of Systems  HENT:  Negative for ear discharge, ear pain, hearing loss and tinnitus.   Eyes:  Negative for photophobia and pain.  Respiratory:  Negative for cough and shortness of breath.   Cardiovascular:  Negative for chest pain.  Gastrointestinal:  Negative for abdominal pain, nausea and vomiting.  Genitourinary:  Negative for dysuria, flank pain, frequency and urgency.  Musculoskeletal:  Positive for  arthralgias (Bilateral knees). Negative for back pain, myalgias and neck pain.  Neurological:  Negative for dizziness and headaches.  Hematological:  Does not bruise/bleed easily.  Psychiatric/Behavioral:  The patient is not nervous/anxious.   Blood pressure 124/76, pulse 64, temperature 99.2 F (37.3 C), temperature source Oral, resp.  rate 11, SpO2 98 %. Physical Exam Constitutional:      General: He is not in acute distress.    Appearance: He is well-developed. He is not diaphoretic.  HENT:     Head: Normocephalic and atraumatic.  Eyes:     General: No scleral icterus.       Right eye: No discharge.        Left eye: No discharge.     Conjunctiva/sclera: Conjunctivae normal.  Cardiovascular:     Rate and Rhythm: Normal rate and regular rhythm.  Pulmonary:     Effort: Pulmonary effort is normal. No respiratory distress.  Musculoskeletal:     Cervical back: Normal range of motion.     Comments: BLE No traumatic wounds, ecchymosis, or rash  Mod TTP knees, unable to SLR  No ankle effusion  Sens DPN, SPN, TN intact  Motor EHL, ext, flex, evers 5/5  DP 1+, PT 1+, No significant edema  Skin:    General: Skin is warm and dry.  Neurological:     Mental Status: He is alert.  Psychiatric:        Mood and Affect: Mood normal.        Behavior: Behavior normal.    Assessment/Plan: Bilateral quad ruptures -- Plan repair tomorrow by Dr. Sherilyn Dacosta. Please keep NPO after MN.    Freeman Caldron, PA-C Orthopedic Surgery [phone] 04/29/2021, 11:40 AM

## 2021-04-29 NOTE — TOC CAGE-AID Note (Signed)
Transition of Care [facility]) - CAGE-AID Screening   Patient Details  Name: DAMACIO WEISGERBER MRN: [id number] Date of Birth: [birth year]  Transition of Care [facility] CM/SW Contact:    Erin Sons, LCSW Phone Number: 04/29/2021, 3:48 PM   Clinical Narrative:  CAGE-AID Completed; score of 0. Pt states he does not drink alcohol or use any other substances.   CAGE-AID Screening:    Have You Ever Felt You Ought to Cut Down on Your Drinking or Drug Use?: No Have People Annoyed You By Critizing Your Drinking Or Drug Use?: No Have You Felt Bad Or Guilty About Your Drinking Or Drug Use?: No Have You Ever Had a Drink or Used Drugs First Thing In The Morning to Steady Your Nerves or to Get Rid of a Hangover?: No CAGE-AID Score: 0  Substance Abuse Education Offered: No

## 2021-04-30 ENCOUNTER — Inpatient Hospital Stay (HOSPITAL_COMMUNITY): Payer: 59 | Admitting: Anesthesiology

## 2021-04-30 ENCOUNTER — Encounter (HOSPITAL_COMMUNITY): Payer: Self-pay | Admitting: Family Medicine

## 2021-04-30 ENCOUNTER — Encounter (HOSPITAL_COMMUNITY)
Admission: EM | Disposition: A | Discharge status: Home Health | Payer: Self-pay | Source: Home / Self Care | Attending: Internal Medicine

## 2021-04-30 DIAGNOSIS — N529 Male erectile dysfunction, unspecified: Secondary | ICD-10-CM

## 2021-04-30 DIAGNOSIS — E785 Hyperlipidemia, unspecified: Secondary | ICD-10-CM

## 2021-04-30 DIAGNOSIS — G7000 Myasthenia gravis without (acute) exacerbation: Secondary | ICD-10-CM

## 2021-04-30 DIAGNOSIS — R748 Abnormal levels of other serum enzymes: Secondary | ICD-10-CM

## 2021-04-30 DIAGNOSIS — R9431 Abnormal electrocardiogram [ECG] [EKG]: Secondary | ICD-10-CM

## 2021-04-30 HISTORY — PX: TENDON REPAIR: SHX5111

## 2021-04-30 LAB — COMPREHENSIVE METABOLIC PANEL
ALT: 31 U/L (ref 0–44)
AST: 28 U/L (ref 15–41)
Albumin: 3.1 g/dL — ABNORMAL LOW (ref 3.5–5.0)
Alkaline Phosphatase: 46 U/L (ref 38–126)
Anion gap: 7 (ref 5–15)
BUN: 9 mg/dL (ref 6–20)
CO2: 22 mmol/L (ref 22–32)
Calcium: 8.4 mg/dL — ABNORMAL LOW (ref 8.9–10.3)
Chloride: 105 mmol/L (ref 98–111)
Creatinine, Ser: 0.85 mg/dL (ref 0.61–1.24)
GFR, Estimated: >60 mL/min (ref >60)
Glucose, Bld: 91 mg/dL (ref 70–99)
Potassium: 3.9 mmol/L (ref 3.5–5.1)
Sodium: 134 mmol/L — ABNORMAL LOW (ref 135–145)
Total Bilirubin: 0.8 mg/dL (ref 0.3–1.2)
Total Protein: 6.1 g/dL — ABNORMAL LOW (ref 6.5–8.1)

## 2021-04-30 LAB — CBC
HCT: 44.8 % (ref 39.0–52.0)
Hemoglobin: 14.5 g/dL (ref 13.0–17.0)
MCH: 32 pg (ref 26.0–34.0)
MCHC: 32.4 g/dL (ref 30.0–36.0)
MCV: 98.9 fL (ref 80.0–100.0)
Platelets: 188 10*3/uL (ref 150–400)
RBC: 4.53 MIL/uL (ref 4.22–5.81)
RDW: 13.4 % (ref 11.5–15.5)
WBC: 9.1 10*3/uL (ref 4.0–10.5)
nRBC: 0 % (ref 0.0–0.2)

## 2021-04-30 LAB — CK: Total CK: 863 U/L — ABNORMAL HIGH (ref 49–397)

## 2021-04-30 SURGERY — TENDON REPAIR
Anesthesia: General | Site: Leg Upper | Laterality: Bilateral

## 2021-04-30 MED ORDER — PROPOFOL 10 MG/ML IV BOLUS
INTRAVENOUS | Status: DC | PRN
  Administered 2021-04-30: 170 mg via INTRAVENOUS

## 2021-04-30 MED ORDER — MIDAZOLAM HCL 2 MG/2ML IJ SOLN
INTRAMUSCULAR | Status: AC
  Filled 2021-04-30: qty 2

## 2021-04-30 MED ORDER — KETAMINE HCL 10 MG/ML IJ SOLN
INTRAMUSCULAR | Status: DC | PRN
  Administered 2021-04-30 (×2): 10 mg via INTRAVENOUS
  Administered 2021-04-30: 30 mg via INTRAVENOUS

## 2021-04-30 MED ORDER — HYDROMORPHONE HCL 1 MG/ML IJ SOLN
0.5000 mg | INTRAMUSCULAR | Status: DC | PRN
  Administered 2021-04-30 – 2021-05-03 (×10): 1 mg via INTRAVENOUS
  Filled 2021-04-30 (×10): qty 1

## 2021-04-30 MED ORDER — CEFAZOLIN SODIUM-DEXTROSE 2-4 GM/100ML-% IV SOLN
2.0000 g | INTRAVENOUS | Status: AC
  Administered 2021-04-30: 2 g via INTRAVENOUS

## 2021-04-30 MED ORDER — TRANEXAMIC ACID-NACL 1000-0.7 MG/100ML-% IV SOLN
INTRAVENOUS | Status: DC | PRN
  Administered 2021-04-30: 1000 mg via INTRAVENOUS

## 2021-04-30 MED ORDER — FENTANYL CITRATE (PF) 100 MCG/2ML IJ SOLN
25.0000 ug | INTRAMUSCULAR | Status: DC | PRN
  Administered 2021-04-30 (×4): 25 ug via INTRAVENOUS

## 2021-04-30 MED ORDER — CHLORHEXIDINE GLUCONATE 0.12 % MT SOLN
OROMUCOSAL | Status: AC
  Filled 2021-04-30: qty 15

## 2021-04-30 MED ORDER — SUGAMMADEX SODIUM 200 MG/2ML IV SOLN
INTRAVENOUS | Status: DC | PRN
  Administered 2021-04-30: 400 mg via INTRAVENOUS

## 2021-04-30 MED ORDER — 0.9 % SODIUM CHLORIDE (POUR BTL) OPTIME
TOPICAL | Status: DC | PRN
  Administered 2021-04-30: 1000 mL

## 2021-04-30 MED ORDER — HYDROMORPHONE HCL 1 MG/ML IJ SOLN
0.2500 mg | INTRAMUSCULAR | Status: DC | PRN
  Administered 2021-04-30 (×3): 0.25 mg via INTRAVENOUS
  Administered 2021-04-30: 0.5 mg via INTRAVENOUS

## 2021-04-30 MED ORDER — HYDROMORPHONE HCL 1 MG/ML IJ SOLN
INTRAMUSCULAR | Status: AC
  Filled 2021-04-30: qty 1

## 2021-04-30 MED ORDER — KETOROLAC TROMETHAMINE 30 MG/ML IJ SOLN
INTRAMUSCULAR | Status: AC
  Filled 2021-04-30: qty 1

## 2021-04-30 MED ORDER — FENTANYL CITRATE (PF) 250 MCG/5ML IJ SOLN
INTRAMUSCULAR | Status: AC
  Filled 2021-04-30: qty 5

## 2021-04-30 MED ORDER — FENTANYL CITRATE (PF) 250 MCG/5ML IJ SOLN
INTRAMUSCULAR | Status: DC | PRN
  Administered 2021-04-30: 100 ug via INTRAVENOUS
  Administered 2021-04-30: 150 ug via INTRAVENOUS
  Administered 2021-04-30: 100 ug via INTRAVENOUS
  Administered 2021-04-30: 50 ug via INTRAVENOUS

## 2021-04-30 MED ORDER — MIDAZOLAM HCL 5 MG/5ML IJ SOLN
INTRAMUSCULAR | Status: DC | PRN
  Administered 2021-04-30: 2 mg via INTRAVENOUS

## 2021-04-30 MED ORDER — POVIDONE-IODINE 10 % EX SWAB: 2.0000 | Freq: Once | CUTANEOUS | Status: DC

## 2021-04-30 MED ORDER — LACTATED RINGERS IV SOLN: INTRAVENOUS | Status: DC

## 2021-04-30 MED ORDER — DEXAMETHASONE SODIUM PHOSPHATE 4 MG/ML IJ SOLN
INTRAMUSCULAR | Status: DC | PRN
  Administered 2021-04-30: 10 mg via INTRAVENOUS

## 2021-04-30 MED ORDER — METOCLOPRAMIDE HCL 5 MG/ML IJ SOLN: 5.0000 mg | Freq: Three times a day (TID) | INTRAMUSCULAR | Status: DC | PRN

## 2021-04-30 MED ORDER — OXYCODONE HCL 5 MG PO TABS
10.0000 mg | ORAL_TABLET | ORAL | Status: DC | PRN
  Administered 2021-04-30 – 2021-05-02 (×7): 15 mg via ORAL
  Administered 2021-05-02: 10 mg via ORAL
  Administered 2021-05-02 – 2021-05-04 (×7): 15 mg via ORAL
  Filled 2021-04-30: qty 2
  Filled 2021-04-30 (×16): qty 3

## 2021-04-30 MED ORDER — CHLORHEXIDINE GLUCONATE 0.12 % MT SOLN
15.0000 mL | Freq: Once | OROMUCOSAL | Status: AC
  Administered 2021-04-30: 15 mL via OROMUCOSAL

## 2021-04-30 MED ORDER — CEFAZOLIN SODIUM-DEXTROSE 2-4 GM/100ML-% IV SOLN
2.0000 g | Freq: Four times a day (QID) | INTRAVENOUS | Status: AC
  Administered 2021-04-30 – 2021-05-01 (×3): 2 g via INTRAVENOUS
  Filled 2021-04-30 (×3): qty 100

## 2021-04-30 MED ORDER — LIDOCAINE-EPINEPHRINE 2 %-1:100000 IJ SOLN
INTRAMUSCULAR | Status: DC | PRN
  Administered 2021-04-30: 30 mL

## 2021-04-30 MED ORDER — CEFAZOLIN SODIUM-DEXTROSE 2-4 GM/100ML-% IV SOLN
INTRAVENOUS | Status: AC
  Filled 2021-04-30: qty 100

## 2021-04-30 MED ORDER — CHLORHEXIDINE GLUCONATE 4 % EX LIQD: 60.0000 mL | Freq: Once | CUTANEOUS | Status: DC

## 2021-04-30 MED ORDER — ACETAMINOPHEN 325 MG PO TABS
325.0000 mg | ORAL_TABLET | Freq: Four times a day (QID) | ORAL | Status: DC | PRN
  Administered 2021-05-03: 650 mg via ORAL
  Filled 2021-04-30: qty 2

## 2021-04-30 MED ORDER — OXYCODONE HCL 5 MG PO TABS
5.0000 mg | ORAL_TABLET | ORAL | Status: DC | PRN
  Administered 2021-05-02 – 2021-05-03 (×2): 10 mg via ORAL
  Administered 2021-05-03: 5 mg via ORAL
  Filled 2021-04-30 (×2): qty 2
  Filled 2021-04-30: qty 1

## 2021-04-30 MED ORDER — ORAL CARE MOUTH RINSE: 15.0000 mL | Freq: Once | OROMUCOSAL | Status: AC

## 2021-04-30 MED ORDER — LIDOCAINE 2% (20 MG/ML) 5 ML SYRINGE
INTRAMUSCULAR | Status: DC | PRN
  Administered 2021-04-30: 100 mg via INTRAVENOUS

## 2021-04-30 MED ORDER — KETAMINE HCL 50 MG/5ML IJ SOSY
PREFILLED_SYRINGE | INTRAMUSCULAR | Status: AC
  Filled 2021-04-30: qty 5

## 2021-04-30 MED ORDER — APIXABAN 2.5 MG PO TABS
2.5000 mg | ORAL_TABLET | Freq: Two times a day (BID) | ORAL | Status: DC
Start: 2021-05-01 — End: 2021-05-04
  Administered 2021-05-01 – 2021-05-04 (×7): 2.5 mg via ORAL
  Filled 2021-04-30 (×8): qty 1

## 2021-04-30 MED ORDER — ONDANSETRON HCL 4 MG/2ML IJ SOLN
INTRAMUSCULAR | Status: DC | PRN
  Administered 2021-04-30: 4 mg via INTRAVENOUS

## 2021-04-30 MED ORDER — KETOROLAC TROMETHAMINE 30 MG/ML IJ SOLN
30.0000 mg | Freq: Once | INTRAMUSCULAR | Status: AC
  Administered 2021-04-30: 30 mg via INTRAVENOUS

## 2021-04-30 MED ORDER — LACTATED RINGERS IV SOLN: INTRAVENOUS | Status: DC | PRN

## 2021-04-30 MED ORDER — ACETAMINOPHEN 10 MG/ML IV SOLN
INTRAVENOUS | Status: AC
  Filled 2021-04-30: qty 100

## 2021-04-30 MED ORDER — ONDANSETRON HCL 4 MG/2ML IJ SOLN: 4.0000 mg | Freq: Once | INTRAMUSCULAR | Status: DC | PRN

## 2021-04-30 MED ORDER — METOCLOPRAMIDE HCL 5 MG PO TABS: 5.0000 mg | ORAL_TABLET | Freq: Three times a day (TID) | ORAL | Status: DC | PRN

## 2021-04-30 MED ORDER — TRANEXAMIC ACID-NACL 1000-0.7 MG/100ML-% IV SOLN
INTRAVENOUS | Status: AC
  Filled 2021-04-30: qty 100

## 2021-04-30 MED ORDER — FENTANYL CITRATE (PF) 100 MCG/2ML IJ SOLN
INTRAMUSCULAR | Status: AC
  Filled 2021-04-30: qty 2

## 2021-04-30 MED ORDER — PROPOFOL 10 MG/ML IV BOLUS
INTRAVENOUS | Status: AC
  Filled 2021-04-30: qty 20

## 2021-04-30 MED ORDER — BUPIVACAINE-EPINEPHRINE (PF) 0.25% -1:200000 IJ SOLN
INTRAMUSCULAR | Status: DC | PRN
  Administered 2021-04-30: 20 mL

## 2021-04-30 MED ORDER — ACETAMINOPHEN 500 MG PO TABS
1000.0000 mg | ORAL_TABLET | Freq: Four times a day (QID) | ORAL | Status: AC
  Administered 2021-04-30 – 2021-05-01 (×4): 1000 mg via ORAL
  Filled 2021-04-30 (×4): qty 2

## 2021-04-30 MED ORDER — DOCUSATE SODIUM 100 MG PO CAPS
100.0000 mg | ORAL_CAPSULE | Freq: Two times a day (BID) | ORAL | Status: DC
  Administered 2021-04-30 – 2021-05-04 (×7): 100 mg via ORAL
  Filled 2021-04-30 (×7): qty 1

## 2021-04-30 MED ORDER — ROCURONIUM BROMIDE 10 MG/ML (PF) SYRINGE
PREFILLED_SYRINGE | INTRAVENOUS | Status: DC | PRN
  Administered 2021-04-30 (×2): 10 mg via INTRAVENOUS
  Administered 2021-04-30: 30 mg via INTRAVENOUS
  Administered 2021-04-30: 10 mg via INTRAVENOUS

## 2021-04-30 SURGICAL SUPPLY — 67 items
BAG COUNTER SPONGE SURGICOUNT (BAG) ×3 IMPLANT
BAG SPNG CNTER NS LX DISP (BAG) ×1
BAG SURGICOUNT SPONGE COUNTING (BAG) ×1
BLADE SURG 10 STRL SS (BLADE) ×2 IMPLANT
BNDG CMPR MED 15X6 ELC VLCR LF (GAUZE/BANDAGES/DRESSINGS) ×2
BNDG ELASTIC 3X5.8 VLCR STR LF (GAUZE/BANDAGES/DRESSINGS) IMPLANT
BNDG ELASTIC 4X5.8 VLCR STR LF (GAUZE/BANDAGES/DRESSINGS) IMPLANT
BNDG ELASTIC 6X15 VLCR STRL LF (GAUZE/BANDAGES/DRESSINGS) ×4 IMPLANT
BNDG GAUZE ELAST 4 BULKY (GAUZE/BANDAGES/DRESSINGS) ×8 IMPLANT
CORD BIPOLAR FORCEPS 12FT (ELECTRODE) ×2 IMPLANT
COVER SURGICAL LIGHT HANDLE (MISCELLANEOUS) ×6 IMPLANT
CUFF TOURN SGL QUICK 34 (TOURNIQUET CUFF) ×6
CUFF TRNQT CYL 34X4.125X (TOURNIQUET CUFF) IMPLANT
DRAPE BILATERAL LIMB T (DRAPES) ×2 IMPLANT
DRAPE IMP U-DRAPE 54X76 (DRAPES) ×2 IMPLANT
DRAPE POUCH INSTRU U-SHP 10X18 (DRAPES) ×2 IMPLANT
DRAPE SURG 17X23 STRL (DRAPES) ×4 IMPLANT
DRAPE U-SHAPE 47X51 STRL (DRAPES) ×2 IMPLANT
DRSG AQUACEL AG ADV 3.5X10 (GAUZE/BANDAGES/DRESSINGS) ×4 IMPLANT
GAUZE 4X4 16PLY ~~LOC~~+RFID DBL (SPONGE) ×2 IMPLANT
GAUZE SPONGE 2X2 8PLY STRL LF (GAUZE/BANDAGES/DRESSINGS) IMPLANT
GAUZE SPONGE 4X4 12PLY STRL (GAUZE/BANDAGES/DRESSINGS) IMPLANT
GAUZE XEROFORM 1X8 LF (GAUZE/BANDAGES/DRESSINGS) IMPLANT
GOWN STRL REUS W/ TWL LRG LVL3 (GOWN DISPOSABLE) ×4 IMPLANT
GOWN STRL REUS W/ TWL XL LVL3 (GOWN DISPOSABLE) ×6 IMPLANT
GOWN STRL REUS W/TWL LRG LVL3 (GOWN DISPOSABLE) ×6
GOWN STRL REUS W/TWL XL LVL3 (GOWN DISPOSABLE) ×9
KIT BASIN OR (CUSTOM PROCEDURE TRAY) ×4 IMPLANT
KIT TURNOVER KIT B (KITS) ×4 IMPLANT
NDL HYPO 25GX1X1/2 BEV (NEEDLE) IMPLANT
NEEDLE 22X1 1/2 (OR ONLY) (NEEDLE) ×2 IMPLANT
NEEDLE HYPO 25GX1X1/2 BEV (NEEDLE) IMPLANT
NS IRRIG 1000ML POUR BTL (IV SOLUTION) ×4 IMPLANT
PACK ORTHO EXTREMITY (CUSTOM PROCEDURE TRAY) ×4 IMPLANT
PAD ARMBOARD 7.5X6 YLW CONV (MISCELLANEOUS) ×8 IMPLANT
PAD CAST 4YDX4 CTTN HI CHSV (CAST SUPPLIES) IMPLANT
PADDING CAST COTTON 4X4 STRL (CAST SUPPLIES)
POUCH LAPAROSCOPIC INSTRUMENT (MISCELLANEOUS) ×2 IMPLANT
SOL PREP POV-IOD 4OZ 10% (MISCELLANEOUS) ×12 IMPLANT
SPECIMEN JAR SMALL (MISCELLANEOUS) ×4 IMPLANT
SPONGE GAUZE 2X2 STER 10/PKG (GAUZE/BANDAGES/DRESSINGS)
SPONGE T-LAP 18X18 ~~LOC~~+RFID (SPONGE) ×4 IMPLANT
STOCKINETTE IMPERVIOUS LG (DRAPES) ×4 IMPLANT
SUCTION FRAZIER HANDLE 10FR (MISCELLANEOUS)
SUCTION TUBE FRAZIER 10FR DISP (MISCELLANEOUS) IMPLANT
SUT FIBERWIRE #2 38 T-5 BLUE (SUTURE) ×6
SUT FIBERWIRE #5 38 CONV BLUE (SUTURE) ×12
SUT MERSILENE 4 0 P 3 (SUTURE) IMPLANT
SUT MNCRL AB 3-0 PS2 27 (SUTURE) ×4 IMPLANT
SUT MON AB 2-0 CT1 36 (SUTURE) ×8 IMPLANT
SUT MON AB 5-0 PS2 18 (SUTURE) ×2 IMPLANT
SUT PROLENE 4 0 PS 2 18 (SUTURE) IMPLANT
SUT VIC AB 1 CT1 27 (SUTURE) ×18
SUT VIC AB 1 CT1 27XBRD ANBCTR (SUTURE) IMPLANT
SUT VIC AB 1 CT1 27XBRD ANTBC (SUTURE) IMPLANT
SUT VIC AB 2-0 CT1 27 (SUTURE)
SUT VIC AB 2-0 CT1 TAPERPNT 27 (SUTURE) IMPLANT
SUT VICRYL 0 TIES 12 18 (SUTURE) ×2 IMPLANT
SUTURE FIBERWR #2 38 T-5 BLUE (SUTURE) IMPLANT
SUTURE FIBERWR #5 38 CONV BLUE (SUTURE) IMPLANT
SYR CONTROL 10ML LL (SYRINGE) ×2 IMPLANT
TOWEL GREEN STERILE (TOWEL DISPOSABLE) ×4 IMPLANT
TOWEL GREEN STERILE FF (TOWEL DISPOSABLE) ×4 IMPLANT
TUBE CONNECTING 12'X1/4 (SUCTIONS)
TUBE CONNECTING 12X1/4 (SUCTIONS) IMPLANT
UNDERPAD 30X36 HEAVY ABSORB (UNDERPADS AND DIAPERS) ×4 IMPLANT
WATER STERILE IRR 1000ML POUR (IV SOLUTION) ×4 IMPLANT

## 2021-04-30 NOTE — Anesthesia Postprocedure Evaluation (Signed)
Anesthesia Post Note  Patient: Jay Rosario  Procedure(s) Performed: QUAD TENDON REPAIR (Bilateral: Leg Upper)     Patient location during evaluation: PACU Anesthesia Type: General Level of consciousness: awake and alert Pain management: pain level controlled Vital Signs Assessment: post-procedure vital signs reviewed and stable Respiratory status: spontaneous breathing, nonlabored ventilation and respiratory function stable Cardiovascular status: blood pressure returned to baseline and stable Postop Assessment: no apparent nausea or vomiting Anesthetic complications: no   No notable events documented.  Last Vitals:  Vitals:   04/30/21 1423 04/30/21 1438  BP: 140/84 (!) 159/75  Pulse: 97 95  Resp: 16 15  Temp:    SpO2: 94% 95%    Last Pain:  Vitals:   04/30/21 1336  TempSrc:   PainSc: 10-Worst pain ever                 Cecile Hearing

## 2021-04-30 NOTE — Op Note (Signed)
OPERATIVE NOTE  Jay Rosario male 55 y.o. 04/30/2021  PREOPERATIVE DIAGNOSIS: Right quadriceps tendon rupture (S76.111) Left quadriceps tendon rupture (T26.712)  POSTOPERATIVE DIAGNOSIS: Right quadriceps tendon rupture (W58.099) Left quadriceps tendon rupture (I33.825)  PROCEDURE(S): Right quadriceps tendon repair (05397) Left quadriceps tendon repair (67341)  SURGEON: Ernestina Columbia, M.D.  ASSISTANT(S): Darron Doom, RNFA  ANESTHESIA: General  FINDINGS: Preoperative Examination: RLE: Skin intact.  Palpable defect superior pole of patella.  Inability to maintain straight leg raise against resistance.  Intact dorsiflexion, plantarflexion, and EHL strength.  Sensation intact to light touch in the superficial peroneal, deep peroneal, and tibial distributions.  Normal distal pulses.  Warm and well perfused distally. LLE: Skin intact.  Palpable defect superior pole of patella.  Inability to maintain straight leg raise against resistance.  Intact dorsiflexion, plantarflexion, and EHL strength.  Sensation intact to light touch in the superficial peroneal, deep peroneal, and tibial distributions.  Normal distal pulses.  Warm and well perfused distally.  Operative Findings: Right lower extremity: Rupture of the quadriceps tendon at the superior pole of the patella.  Vastus intermedialis intact at the patella.  Retinacular ruptures extending medially with propagation into the VMO.  Lateral retinacular rupture with some propagation into the vastus lateralis proximally.   Left lower extremity: Complete rupture of the quadriceps tendon at the superior pole of the patella.  No fibers intact.  Retinacular rupture extending medially.  Laterally, proximal propagation through the vastus lateralis.  IMPLANTS: * No implants in log *  INDICATIONS:  The patient is a 55 y.o. male who fell down the stairs and sustained bilateral quadriceps ruptures.  He came to the emergency room where bilateral  knee MRIs were obtained, demonstrating bilateral quadriceps ruptures.  Previously he was completely ambulatory and functional.  He understood the risks, benefits and alternatives to surgery which include but are not limited to bleeding, wound healing complications, infection, damage to surrounding structures, persistent pain, stiffness, lack of improvement, potential for subsequent arthritis or worsening of pre-existing arthritis, and need for further surgery, as well as complications related to anesthesia, cardiovascular complications, and death.  He also understood the potential for continued pain in that there were no guarantees of acceptable outcome.  After weighing these risks the patient opted to proceed with surgery.  PROCEDURE: Patient was identified in the preoperative holding area.  The right knee and left knee were marked by myself.  Consent was signed by myself and the patient.  No block was performed by anesthesia in the preoperative holding area.  Patient was taken to the operative suite and placed supine on the operative table.  Anesthesia was induced by the anesthesia team.  The patient was positioned appropriately for the procedure and all bony prominences were well padded.   Nonsterile thigh tourniquets were placed bilaterally.   Preoperative antibiotics were given.  TXA was also administered.  The extremities was prepped and draped in the usual sterile fashion and surgical timeout was performed.  We began with the right lower extremity first.  A midline incision extending distal to the patella and proximal to the patella was marked out on the skin.  Skin was exsanguinated with an Esmarch bandage.  Tourniquet was inflated to 250 mmHg.  Skin was incised sharply, with dissection of the underlying subcutaneous tissues and fat down onto the quadriceps rupture.  The ruptured quadriceps tendon was readily identified.  Additionally identified were retinacular defects and propagation into the muscles as  noted above in the findings.  The there was  a central slip of quadriceps tendon which remained fully intact of the patella, as noted above and findings.  This was healthy tissue and was not removed from the patella.  The nonviable, friable ruptured fibers were debrided off the superior pole of the patella, exposing a healthy bleeding bed of bone.  3 osseous tunnels were drilled through the patella and parallel.  Suture loops were passed.  Two #5 FiberWire sutures were used to create Krakw stitches in the ruptured portion of the quadriceps tendon, running proximally and then distally.  These achieved excellent purchase and were able to easily reduce the rupture portion of the tendon down onto the superior pole of patella.  The medial limb was passed through the medial tunnel.  The 2 middle limbs were passed through the middle tunnel.  Lateral limb was passed through the lateral tunnel.  The to medial linens were retrieved underneath the patellar tendon distally, onto the medial and lateral sides.  With the knee in full extension and a bump under the foot, the tendon was fully tensioned down onto the superior pole of the patella with no gapping, and the medial and lateral pairs and sutures were tied on the distal pole of the patella.  The defects in the patellar tendon were closed with #1 Vicryl suture in figure-of-eight.  The wound was copiously irrigated.  The deep layer was closed with simple inverted #1 Vicryl.  The skin was closed with simple inverted 2-0 Monocryl, followed by running 3-0 Monocryl subcuticular.  Skin was sealed with Dermabond.  Aquacel dressing was placed.  Tourniquet was let down.  Total tourniquet time for the right lower extremity was 60 minutes.  Attention was then turned to the left lower extremity.A midline incision extending distal to the patella and proximal to the patella was marked out on the skin.  Skin was exsanguinated with an Esmarch bandage.  Tourniquet was inflated to 250 mmHg.   Skin was incised sharply, with dissection of the underlying subcutaneous tissues and fat down onto the quadriceps rupture.  The ruptured quadriceps tendon was readily identified.  Additionally identified were retinacular defects and propagation into the muscles as noted above in the findings.  The tendon was fully avulsed on this side.  The nonviable, friable ruptured fibers were debrided off the superior pole of the patella, exposing a healthy bleeding bed of bone.  3 osseous tunnels were drilled through the patella and parallel.  Suture loops were passed.  Two #5 FiberWire sutures were used to create Krakw stitches in the ruptured portion of the quadriceps tendon, running proximally and then distally.  These achieved excellent purchase and were able to easily reduce the rupture portion of the tendon down onto the superior pole of patella.  The medial limb was passed through the medial tunnel.  The 2 middle limbs were passed through the middle tunnel.  Lateral limb was passed through the lateral tunnel.  The to medial linens were retrieved underneath the patellar tendon distally, onto the medial and lateral sides.  With the knee in full extension and a bump under the foot, the tendon was fully tensioned down onto the superior pole of the patella with no gapping, and the medial and lateral pairs and sutures were tied on the distal pole of the patella.  The defects in the patellar tendon were closed with #1 Vicryl suture in figure-of-eight.  The wound was copiously irrigated.  The deep layer was closed with simple inverted #1 Vicryl.  The skin was closed  with simple inverted 2-0 Monocryl, followed by running 3-0 Monocryl subcuticular.  Skin was sealed with Dermabond.  Aquacel dressing was placed.  Tourniquet was let down.  Total tourniquet time for the left lower extremity was 80 minutes.  On the right side, the skin around the incision was infiltrated with 30 cc of 2% lidocaine with epinephrine.  On the left side,  the skin around the incision was infiltrated with 20 cc of 0.25% bupivacaine with epinephrine.  POST OPERATIVE INSTRUCTIONS: Mobility: Will require wheelchair for 2 weeks while nonweightbearing on bilateral lower extremities. Pain control: Continue to wean/titrate to appropriate oral regimen DVT Prophylaxis: Eliquis 2.5 mg p.o. twice daily x6 weeks Further surgical plans: None RUE: No restrictions LUE: No restrictions RLE: Nonweightbearing, knee immobilizer at all times except hygiene LLE: Nonweightbearing, knee immobilizer at all times except hygiene Disposition: Appropriate for discharge from orthopedic standpoint Dressing care: Keep Mepilex on and dry for 5 days.  Do not allow surgical area to get wet before that.  Remove Mepilex dressing after 5 days and allow area to get wet in shower but DO NOT SUBMERGE. Follow-up: Please call [facility] [phone]) to schedule follow-op appointment for 2 weeks after surgery.  TOURNIQUET TIME: 60 minutes on the right, 80 minutes on the left  BLOOD LOSS: 40 cc         DRAINS: none         SPECIMEN: none       COMPLICATIONS:  * No complications entered in OR log *         DISPOSITION: PACU - hemodynamically stable.         CONDITION: stable   Ernestina Columbia M.D. Orthopaedic Surgery [facility]   Portions of the record have been created with voice recognition software.  Grammatical and punctuation errors, random word insertions, wrong-word or "sound-a-like" substitutions, pronoun errors (inaccuracies and/or substitutions), and/or incomplete sentences may have occurred due to the inherent limitations of voice recognition software.  Not all errors are caught or corrected.  Although every attempt is made to root out erroneous and incomplete transcription, the note may still not fully represent the intent or opinion of the author.  Read the chart carefully and recognize, using context,  where errors/substitutions have occurred.  Any questions or concerns about the content of this note or information contained within the body of this dictation should be addressed directly with the author for clarification.

## 2021-04-30 NOTE — Progress Notes (Signed)
MD Swayze made aware of patient's post-op EKG. Per MD no telemetry needed post-op based off EKG results.

## 2021-04-30 NOTE — Progress Notes (Signed)
PROGRESS NOTE  Jay Rosario MLY:[id number] DOB: [birth year] DOA: 04/28/2021 PCP: Ronnald Nian, MD  Brief History   Jay Rosario is a 55 y.o. male with medical history significant of Myasthenia gravis on chronic steroids, HLD presenting to ED with mechanical fall and subsequent bilateral knee pain. He was at a friends house and walking down some steps looking at his phone. He thought he was done with the steps and apparently fell stepping down. He doesn't remember how he fell, but states it was the worse pain of his life. Pain was 10/10 and felt like someone was tearing his legs apart. No radiation. No loss of sensation down the legs. He was immediately unable to bear weight and his wife called 911. He can move the legs side to side, but that is all. He continues to have excruciating pain.      No fever/chills, headaches/vision changes, chest pain or palpitations, no shortness of breath. He has had a mild cough a week ago, but is better and is dry in nature, no stomach pain/N/V/D, no leg swelling or dysuria.    He has a history of MG that he is not followed by neurology any longer. Has history of flairs in the past, but states it only affects his eyes. He denies any visual changes, double vision, blurry vision or ptosis.    ED Course: vitals: afebrile later on tmax of 100.7, bp: 137/85, HR: 73, RR: 20, oxygen 98% RA Pertinent labs: lactic acid 2.1>1.5, CK: 645, UA: 30 protein,  MRI left knee: high grade complete or near complete rupture of the distal quadriceps tendon. Intrasubstance degeneration of the medial meniscus without discrete tear.  MRI right knee: high grade, near complete tear of the quadriceps tendon at the patellar insertion. Small joint effusion and extensive adjacent soft tissue swelling. Nondisplaced horizontal tear of the anterior horn and body of the lateral meniscus with adjacent parameniscal cyst formation. In ED: given pain medication, ortho was consulted and [facility] was  asked to admit.    Consultants  Orthopedic surgery  Procedures  Right quadriceps tendon repair Left quadriceps tendon repair  Antibiotics   Anti-infectives (From admission, onward)    Start     Dose/Rate Route Frequency Ordered Stop   04/30/21 1630  ceFAZolin (ANCEF) IVPB 2g/100 mL premix        2 g 200 mL/hr over 30 Minutes Intravenous Every 6 hours 04/30/21 1510 05/01/21 1029   04/30/21 0745  ceFAZolin (ANCEF) IVPB 2g/100 mL premix        2 g 200 mL/hr over 30 Minutes Intravenous On call to O.R. 04/30/21 0736 04/30/21 0930   04/30/21 0737  ceFAZolin (ANCEF) 2-4 GM/100ML-% IVPB       Note to Pharmacy: Cyndia Diver R: cabinet override      04/30/21 0737 04/30/21 1944      Subjective  The patient is seen post operatively. He is having pain that is not covered by current pain control. No other complaints.   Objective   Vitals:  Vitals:   04/30/21 1542 04/30/21 1700  BP: (!) 174/89 120/60  Pulse: 93 86  Resp: (!) 22 13  Temp: 98.8 F (37.1 C)   SpO2: 97% 96%    Exam:  Constitutional:  The patient is awake, alert, and oriented x 3. Mild distress from pain. Respiratory:  No increased work of breathing. No wheezes, rales, or rhonchi No tactile fremitus Cardiovascular:  Regular rate and rhythm No murmurs, ectopy, or gallups. No lateral  PMI. No thrills. Abdomen:  Abdomen is soft, non-tender, non-distended No hernias, masses, or organomegaly Normoactive bowel sounds.  Musculoskeletal:  No cyanosis, clubbing, or edema Skin:  No rashes, lesions, ulcers palpation of skin: no induration or nodules Psychiatric:  Mental status Mood, affect appropriate Orientation to person, place, time  judgment and insight appear intact   I have personally reviewed the following:   Today's Data  Vitals  Lab Data  CBC CMP  Micro Data    Imaging  X-ray Chest X-ray Femur  X-ray pelvis MRI Right knee MRI Left Knee  Cardiology Data  EKG  Scheduled Meds:   acetaminophen  1,000 mg Oral Q6H   [START ON 05/01/2021] apixaban  2.5 mg Oral BID   chlorhexidine       docusate sodium  100 mg Oral BID   fentaNYL       HYDROmorphone       HYDROmorphone       ketorolac       predniSONE  10 mg Oral Q breakfast   Continuous Infusions:  sodium chloride 100 mL/hr at 04/30/21 1536   ceFAZolin      ceFAZolin (ANCEF) IV 2 g (04/30/21 1549)    Principal Problem:   Quadriceps muscle rupture Active Problems:   Myasthenia gravis (HCC)   Hyperlipidemia with target LDL less than 130   Erectile dysfunction   Elevated CK   Prolonged QT interval   LOS: 1 day   A & P  Principal Problem:  bilateral Quadriceps muscle rupture 55 year old male presenting to ED after mechanical fall with subsequent bilateral knee pain and inability to bear weight found to have bilateral ruptured quadriceps -admit to medical telemetry -ortho consulted, likely OR tomorrow. Knee immobilizers bilaterally  -pain control with norco and dilaudid -IVF fluids -plan per ortho   Active Problems:   Elevated CK Acquired due to trauma from mechanical fall  CK to 656 secondary to above. Asked ED to give bolus and will put him on maintenance IVF at 100cc/hour. Monitor CK and creatinine. Will add Na HCO3 for elevated creatinine or high CK. Repeat CK and BMP in AM Today no AKI, electrolytes wnl. UA with no indication of myoglobin. Had tmax to 100.7 which could be secondary to this. No white count or other signs of infection. Follow fever curve, trend cbc/CK      Prolonged QT interval- Resolved. New since ekg in 2019, possibly secondary to elevated CK Check magnesium, other electrolytes wnl Telemetry x 24 hours Repeat ekg in AM Avoid qt prolonging drugs     Myasthenia gravis (HCC) No signs of exacerbation at this time.  Continue daily prednisone at 10mg /daily     Erectile dysfunction Hold cialis while inpatient      Hyperlipidemia with target LDL less than 130 Diet controlled    There is no height or weight on file to calculate BMI.  I have seen and examined this patient myself I have spent 34 minutes in his evaluation and care.   Level of care: Telemetry Medical DVT prophylaxis:  TED hose  Code Status:  Full - confirmed with patient/family Family Communication: wife at bedside:  Disposition Plan:  The patient is from: home             Anticipated d/c is to: tbd               Requires inpatient hospitalization and is at significant risk of worsening, requires constant monitoring, assessment/intervention, IV pain control  and MDM with specialists.      Terease Marcotte, DO [facility] Direct contact: see [email or web address]  7PM-7AM contact night coverage as above 04/30/2021, 6:55 PM  LOS: 1 day

## 2021-04-30 NOTE — H&P (Signed)
Interval History and Physical Update Note  Jay Rosario is a 55 y.o. male who has presented today for surgery, with the diagnosis of bilateral quadriceps tendon ruptures.  The various methods of treatment have been discussed with the patient and family. After consideration of risks, benefits and other options for treatment, the patient has consented to QUAD TENDON REPAIR as a surgical intervention.  The patient's history has been reviewed, patient examined, no change in status, stable for surgery.  I have reviewed the patient's chart and labs.  Questions were answered to the patient's satisfaction.    Physical Examination CV: Normal distal pulses Lungs: Unlabored respirations RLE: Skin intact.  Palpable defect superior pole of patella.  Inability to maintain straight leg raise against resistance.  Intact dorsiflexion, plantarflexion, and EHL strength.  Sensation intact to light touch in the superficial peroneal, deep peroneal, and tibial distributions.  Normal distal pulses.  Warm and well perfused distally. LLE: Skin intact.  Palpable defect superior pole of patella.  Inability to maintain straight leg raise against resistance.  Intact dorsiflexion, plantarflexion, and EHL strength.  Sensation intact to light touch in the superficial peroneal, deep peroneal, and tibial distributions.  Normal distal pulses.  Warm and well perfused distally.  Ernestina Columbia M.D. Orthopaedic Surgery [facility]

## 2021-04-30 NOTE — Discharge Instructions (Addendum)
Discharge instructions for Dr. Ernestina Columbia, M.D.: Please refer to the two-sided discharge instructions paper that Dr. Sherilyn Dacosta placed in the patient's paper chart. Please give this to the patient to take home after reviewing with the patient!!   General discharge instructions:  PLEASE REFER TO TWO-SIDED PAPER INSTRUCTIONS IN PAPER CHART FOR SPECIFIC INSTRUCTIONS!!!  Diet: As you were doing prior to hospitalization. Shower:  Unless otherwise specified (i.e. on two-sided paper instructions with paper chart) may shower but keep the wounds dry, use an occlusive plastic wrap, NO SOAKING IN TUB.  If the bandage gets wet, change with a clean dry gauze. Dressing:  Unless otherwise specified (i.e. on two-sided paper instructions with paper chart), may change your dressing 3-5 days after surgery.  Then change the dressing daily with sterile gauze dressing.  If there are sticky tapes (steri-strips) on your wounds and all the stitches are absorbable.  Leave the steri-strips in place when changing your dressings, they will peel off with time, usually 2-3 weeks. Activity:  Increase activity slowly as tolerated, but follow the restrictions on the two-sided paper discharge instructions sheet that Dr. Sherilyn Dacosta placed in the paper chart.  No lifting or driving for 6 weeks. Weight Bearing: NONWEIGHTBEARING (NWB) BILATERAL LOWER EXTREMITY for 2 weeks (until first postop visit)  To prevent constipation: You may use over-the-counter stool softener(s) such as Colace (over the counter) 100 mg by mouth twice a day and/or Miralax (over the counter) for constipation as needed.  Drink plenty of fluids (prune juice may be helpful) and high fiber foods.  Itching:  If you experience itching with your medications, try taking only a single pain pill, or even half a pain pill at a time.  You can also use benadryl over the counter for itching or also to help with sleep.  Precautions:  If you experience chest pain or shortness of breath -  call 911 immediately for transfer to the hospital emergency department!! To prevent blood clots (DVTs): Take Eliquis (apixiban) 2.5 mg by mouth twice a day until discontinued.  PLEASE REFER TO TWO-SIDED PAPER INSTRUCTIONS IN PAPER CHART FOR SPECIFIC INSTRUCTIONS!!!  If you develop a fever greater that 101.1 deg F, purulent drainage from wound, increased redness or drainage from wound, or calf pain -- Call the office at [phone].  Information on my medicine - ELIQUIS (apixaban)  This medication education was reviewed with me or my healthcare representative as part of my discharge preparation.    Why was Eliquis prescribed for you? Eliquis was prescribed for you to reduce the risk of blood clots forming after orthopedic surgery.    What do You need to know about Eliquis? Take your Eliquis TWICE DAILY - one tablet in the morning and one tablet in the evening with or without food.  It would be best to take the dose about the same time each day.  If you have difficulty swallowing the tablet whole please discuss with your pharmacist how to take the medication safely.  Take Eliquis exactly as prescribed by your doctor and DO NOT stop taking Eliquis without talking to the doctor who prescribed the medication.  Stopping without other medication to take the place of Eliquis may increase your risk of developing a clot.  After discharge, you should have regular check-up appointments with your healthcare provider that is prescribing your Eliquis.  What do you do if you miss a dose? If a dose of ELIQUIS is not taken at the scheduled time, take it as soon as possible  on the same day and twice-daily administration should be resumed.  The dose should not be doubled to make up for a missed dose.  Do not take more than one tablet of ELIQUIS at the same time.  Important Safety Information A possible side effect of Eliquis is bleeding. You should call your healthcare provider right away if you  experience any of the following: Bleeding from an injury or your nose that does not stop. Unusual colored urine (red or dark brown) or unusual colored stools (red or black). Unusual bruising for unknown reasons. A serious fall or if you hit your head (even if there is no bleeding).  Some medicines may interact with Eliquis and might increase your risk of bleeding or clotting while on Eliquis. To help avoid this, consult your healthcare provider or pharmacist prior to using any new prescription or non-prescription medications, including herbals, vitamins, non-steroidal anti-inflammatory drugs (NSAIDs) and supplements.  This website has more information on Eliquis (apixaban): http://www.eliquis.com/eliquis/home

## 2021-04-30 NOTE — Anesthesia Preprocedure Evaluation (Addendum)
Anesthesia Evaluation  Patient identified by MRN, date of birth, ID band Patient awake    Reviewed: Allergy & Precautions, NPO status , Patient's Chart, lab work & pertinent test results  Airway Mallampati: III  TM Distance: >3 FB Neck ROM: Full    Dental  (+) Dental Advisory Given, Edentulous Lower, Edentulous Upper   Pulmonary neg pulmonary ROS,    Pulmonary exam normal breath sounds clear to auscultation       Cardiovascular negative cardio ROS Normal cardiovascular exam Rhythm:Regular Rate:Normal     Neuro/Psych Myasthenia gravis-daily prednisone   Neuromuscular disease negative psych ROS   GI/Hepatic negative GI ROS, Neg liver ROS,   Endo/Other  Obesity   Renal/GU negative Renal ROS     Musculoskeletal Bilateral quad tendon rupture    Abdominal   Peds  Hematology negative hematology ROS (+)   Anesthesia Other Findings Day of surgery medications reviewed with the patient.  Reproductive/Obstetrics                            Anesthesia Physical Anesthesia Plan  ASA: 3  Anesthesia Plan: General   Post-op Pain Management: Toradol IV (intra-op), Ofirmev IV (intra-op) and Ketamine IV   Induction: Intravenous  PONV Risk Score and Plan: 2 and Midazolam, Dexamethasone and Ondansetron  Airway Management Planned: Oral ETT  Additional Equipment:   Intra-op Plan:   Post-operative Plan: Extubation in OR  Informed Consent: I have reviewed the patients History and Physical, chart, labs and discussed the procedure including the risks, benefits and alternatives for the proposed anesthesia with the patient or authorized representative who has indicated his/her understanding and acceptance.     Dental advisory given  Plan Discussed with: CRNA  Anesthesia Plan Comments:        Anesthesia Quick Evaluation

## 2021-04-30 NOTE — Transfer of Care (Signed)
Immediate Anesthesia Transfer of Care Note  Patient: TEVITA GOMER  Procedure(s) Performed: QUAD TENDON REPAIR (Bilateral: Leg Upper)  Patient Location: PACU  Anesthesia Type:General  Level of Consciousness: drowsy  Airway & Oxygen Therapy: Patient Spontanous Breathing and Patient connected to face mask oxygen  Post-op Assessment: Report given to RN and Post -op Vital signs reviewed and stable  Post vital signs: Reviewed and stable  Last Vitals:  Vitals Value Taken Time  BP 181/84   Temp    Pulse 110 04/30/21 1256  Resp 27 04/30/21 1258  SpO2 96 % 04/30/21 1256  Vitals shown include unvalidated device data.  Last Pain:  Vitals:   04/30/21 0626  TempSrc:   PainSc: Asleep         Complications: No notable events documented.

## 2021-04-30 NOTE — Anesthesia Procedure Notes (Signed)
Procedure Name: Intubation Date/Time: 04/30/2021 9:43 AM Performed by: Eligha Bridegroom, CRNA Pre-anesthesia Checklist: Patient identified, Emergency Drugs available, Suction available, Patient being monitored and Timeout performed Patient Re-evaluated:Patient Re-evaluated prior to induction Oxygen Delivery Method: Circle system utilized Preoxygenation: Pre-oxygenation with 100% oxygen Induction Type: IV induction Ventilation: Mask ventilation without difficulty and Oral airway inserted - appropriate to patient size Laryngoscope Size: Mac and 4 Grade View: Grade I Tube type: Oral Tube size: 7.5 mm Number of attempts: 1 Airway Equipment and Method: Stylet Secured at: 22 cm Tube secured with: Tape Dental Injury: Teeth and Oropharynx as per pre-operative assessment

## 2021-05-01 ENCOUNTER — Encounter (HOSPITAL_COMMUNITY): Payer: Self-pay | Admitting: Orthopedic Surgery

## 2021-05-01 ENCOUNTER — Other Ambulatory Visit (HOSPITAL_COMMUNITY): Payer: Self-pay

## 2021-05-01 LAB — CBC WITH DIFFERENTIAL/PLATELET
Abs Immature Granulocytes: 0.07 10*3/uL (ref 0.00–0.07)
Basophils Absolute: 0 10*3/uL (ref 0.0–0.1)
Basophils Relative: 0 %
Eosinophils Absolute: 0 10*3/uL (ref 0.0–0.5)
Eosinophils Relative: 0 %
HCT: 40.7 % (ref 39.0–52.0)
Hemoglobin: 14.1 g/dL (ref 13.0–17.0)
Immature Granulocytes: 1 %
Lymphocytes Relative: 10 %
Lymphs Abs: 1.4 10*3/uL (ref 0.7–4.0)
MCH: 32.7 pg (ref 26.0–34.0)
MCHC: 34.6 g/dL (ref 30.0–36.0)
MCV: 94.4 fL (ref 80.0–100.0)
Monocytes Absolute: 2.2 10*3/uL — ABNORMAL HIGH (ref 0.1–1.0)
Monocytes Relative: 15 %
Neutro Abs: 10.3 10*3/uL — ABNORMAL HIGH (ref 1.7–7.7)
Neutrophils Relative %: 74 %
Platelets: 184 10*3/uL (ref 150–400)
RBC: 4.31 MIL/uL (ref 4.22–5.81)
RDW: 12.9 % (ref 11.5–15.5)
WBC: 14 10*3/uL — ABNORMAL HIGH (ref 4.0–10.5)
nRBC: 0 % (ref 0.0–0.2)

## 2021-05-01 LAB — COMPREHENSIVE METABOLIC PANEL
ALT: 28 U/L (ref 0–44)
AST: 39 U/L (ref 15–41)
Albumin: 2.8 g/dL — ABNORMAL LOW (ref 3.5–5.0)
Alkaline Phosphatase: 44 U/L (ref 38–126)
Anion gap: 7 (ref 5–15)
BUN: 11 mg/dL (ref 6–20)
CO2: 22 mmol/L (ref 22–32)
Calcium: 8 mg/dL — ABNORMAL LOW (ref 8.9–10.3)
Chloride: 101 mmol/L (ref 98–111)
Creatinine, Ser: 0.87 mg/dL (ref 0.61–1.24)
GFR, Estimated: >60 mL/min (ref >60)
Glucose, Bld: 134 mg/dL — ABNORMAL HIGH (ref 70–99)
Potassium: 3.8 mmol/L (ref 3.5–5.1)
Sodium: 130 mmol/L — ABNORMAL LOW (ref 135–145)
Total Bilirubin: 0.7 mg/dL (ref 0.3–1.2)
Total Protein: 6.1 g/dL — ABNORMAL LOW (ref 6.5–8.1)

## 2021-05-01 LAB — CK: Total CK: 1805 U/L — ABNORMAL HIGH (ref 49–397)

## 2021-05-01 MED ORDER — STERILE WATER FOR INJECTION IV SOLN
INTRAVENOUS | Status: DC
  Filled 2021-05-01 (×2): qty 150
  Filled 2021-05-01 (×2): qty 1000

## 2021-05-01 NOTE — TOC Initial Note (Signed)
Transition of Care [facility]) - Initial/Assessment Note    Patient Details  Name: JERELL DEMERY MRN: [id number] Date of Birth: Dec 11, 1965  [facility] CM/SW Contact:    Kingsley Plan, RN Phone Number: 05/01/2021, 1:09 PM  Clinical Narrative:                 Spoke to patient, wife and his mother at bedside.   Patient asking for hospital bed at home.   NCM explained MD just ordered PT to come and assess him. PT will provide recommendations for home DME and PT follow up. Once PT makes recommendations, will discuss with MD for orders. Face sheet information confirmed.   Best contact is patient's wife Rinaldo Cloud [phone].   NCM provided 30 day free Eliquis card and $10 co pay card and explained. If patient discharges on Monday to Friday 9 am to 4pm he would like to use [facility] pharmacy. NCM changed Pharmacy to [facility].   Patient asking where his HR department can send short term disability paperwork to, NCM provided DR Looney's office number.   NCM will continue to follow and await recommendations.          Patient Goals and CMS Choice Patient states their goals for this hospitalization and ongoing recovery are:: to go home CMS Medicare.gov Compare Post Acute Care list provided to:: Patient    Expected Discharge Plan and Services     Discharge Planning Services: CM Consult   Living arrangements for the past 2 months: Single Family Home                                      Prior Living Arrangements/Services Living arrangements for the past 2 months: Single Family Home Lives with:: Spouse Patient language and need for interpreter reviewed:: Yes Do you feel safe going back to the place where you live?: Yes      Need for Family Participation in Patient Care: Yes (Comment) Care giver support system in place?: Yes (comment)      Activities of Daily Living Home Assistive Devices/Equipment: None ADL Screening (condition at time of admission) Patient's  cognitive ability adequate to safely complete daily activities?: Yes Is the patient deaf or have difficulty hearing?: No Does the patient have difficulty seeing, even when wearing glasses/contacts?: No Does the patient have difficulty concentrating, remembering, or making decisions?: No Patient able to express need for assistance with ADLs?: Yes Does the patient have difficulty dressing or bathing?: Yes Independently performs ADLs?: Yes (appropriate for developmental age) Does the patient have difficulty walking or climbing stairs?: Yes Weakness of Legs: Both Weakness of Arms/Hands: None  Permission Sought/Granted   Permission granted to share information with : Yes, Verbal Permission Granted  Share Information with NAME: wife Rinaldo Cloud and mother           Emotional Assessment Appearance:: Appears stated age Attitude/Demeanor/Rapport: Engaged Affect (typically observed): Accepting Orientation: : Oriented to Self, Oriented to Place, Oriented to  Time, Oriented to Situation Alcohol / Substance Use: Not Applicable Psych Involvement: No (comment)  Admission diagnosis:  Myalgia [M79.10] Fall [W19.XXXA] Quadriceps muscle rupture [S76.119A] Quadriceps tendon rupture, unspecified laterality, sequela [S76.119S] Patient Active Problem List   Diagnosis Date Noted   Quadriceps muscle rupture 04/29/2021   Elevated CK 04/29/2021   Prolonged QT interval 04/29/2021   Erectile dysfunction 12/29/2017   Hyperlipidemia with target LDL less than 130 09/06/2012  Obesity (BMI 30-39.9) 04/01/2011   Myasthenia gravis (HCC) 02/20/2011   PCP:  Ronnald Nian, MD Pharmacy:   [id number] - [place], [place] - [address] AT [facility] [address] [place] [address] Phone: [phone] Fax: [phone]  [facility] [facility] [address] - [place], [place] - [address] [facility] [place] [address] Phone: [phone] Fax:  [phone]  [facility] [id number] - [place], [place] - [address] [facility] [address] [place] [address] Phone: [phone] Fax: [phone]     Social Determinants of Health (SDOH) Interventions    Readmission Risk Interventions No flowsheet data found.

## 2021-05-01 NOTE — TOC Benefit Eligibility Note (Signed)
Patient Product/process development scientist completed.    The patient is currently admitted and upon discharge could be taking Eliquis 2.5 mg.  The current 42 day co-pay is, $400.00 due to a $4,950.00 deductible remaining.   The patient is insured through [facility]     Roland Earl, CPhT Pharmacy Patient Advocate Specialist [facility] Patient Advocate Team Direct Number: [phone]  Fax: [phone]

## 2021-05-01 NOTE — Progress Notes (Addendum)
Orthopaedics Daily Progress Note   05/01/2021    Jay Rosario is a 55 y.o. male 1 Day Post-Op s/p QUAD TENDON REPAIR  Subjective Pain well controlled.  Denies nausea, vomiting, or fevers.  Objective Vitals:   05/01/21 0732 05/01/21 1644  BP: 140/83 (!) 160/78  Pulse: 74 82  Resp: 16 16  Temp: 98.4 F (36.9 C) 99.1 F (37.3 C)  SpO2: 94% 97%    Intake/Output Summary (Last 24 hours) at 05/01/2021 2028 Last data filed at 05/01/2021 1948 Gross per 24 hour  Intake 1360.44 ml  Output 1600 ml  Net -239.56 ml    Physical Exam BLE: Dressing clean, dry, and intact Knee immobilizers in place and appropriately positioned +DF/PF/EHL SILT SP/DP/T +DP/PT  Assessment 55 y.o. male s/p Procedure(s) (LRB): QUAD TENDON REPAIR (Bilateral)  Plan Mobility: Will require wheelchair for 2 weeks while nonweightbearing on bilateral lower extremities. Pain control: Continue to wean/titrate to appropriate oral regimen DVT Prophylaxis: Eliquis 2.5 mg p.o. twice daily x6 weeks Further surgical plans: None RUE: No restrictions LUE: No restrictions RLE: Nonweightbearing, knee immobilizer at all times except hygiene LLE: Nonweightbearing, knee immobilizer at all times except hygiene Disposition: Appropriate for discharge from orthopedic standpoint Dressing care: Keep Mepilex on and dry for 5 days.  Do not allow surgical area to get wet before that.  Remove Mepilex dressing after 5 days and allow area to get wet in shower but DO NOT SUBMERGE. Follow-up: Please call [facility] [phone]) to schedule follow-op appointment for 2 weeks after surgery.   Ernestina Columbia M.D. Orthopaedic Surgery [facility]

## 2021-05-01 NOTE — Progress Notes (Signed)
PROGRESS NOTE  Jay Rosario [id number]:[id number] DOB: 07-Jun-1965 DOA: 04/28/2021 PCP: Ronnald Nian, MD  Brief History   Jay Rosario is a 55 y.o. male with medical history significant of Myasthenia gravis on chronic steroids, HLD presenting to ED with mechanical fall and subsequent bilateral knee pain. He was at a friends house and walking down some steps looking at his phone. He thought he was done with the steps and apparently fell stepping down. He doesn't remember how he fell, but states it was the worse pain of his life. Pain was 10/10 and felt like someone was tearing his legs apart. No radiation. No loss of sensation down the legs. He was immediately unable to bear weight and his wife called 911. He can move the legs side to side, but that is all. He continues to have excruciating pain.      No fever/chills, headaches/vision changes, chest pain or palpitations, no shortness of breath. He has had a mild cough a week ago, but is better and is dry in nature, no stomach pain/N/V/D, no leg swelling or dysuria.    He has a history of MG that he is not followed by neurology any longer. Has history of flairs in the past, but states it only affects his eyes. He denies any visual changes, double vision, blurry vision or ptosis.    ED Course: vitals: afebrile later on tmax of 100.7, bp: 137/85, HR: 73, RR: 20, oxygen 98% RA Pertinent labs: lactic acid 2.1>1.5, CK: 645, UA: 30 protein,  MRI left knee: high grade complete or near complete rupture of the distal quadriceps tendon. Intrasubstance degeneration of the medial meniscus without discrete tear.  MRI right knee: high grade, near complete tear of the quadriceps tendon at the patellar insertion. Small joint effusion and extensive adjacent soft tissue swelling. Nondisplaced horizontal tear of the anterior horn and body of the lateral meniscus with adjacent parameniscal cyst formation. In ED: given pain medication, ortho was consulted and TRH was  asked to admit.   CK has increased to 1800. Will change IV lfuids from NS to sodium bicarbonate. Monitor electrolytes, creatinine, and CK.  The patient continues to complain of pain.   Consultants  Orthopedic surgery  Procedures  Right quadriceps tendon repair Left quadriceps tendon repair  Antibiotics   Anti-infectives (From admission, onward)    Start     Dose/Rate Route Frequency Ordered Stop   04/30/21 1630  ceFAZolin (ANCEF) IVPB 2g/100 mL premix        2 g 200 mL/hr over 30 Minutes Intravenous Every 6 hours 04/30/21 1510 05/01/21 0454   04/30/21 0745  ceFAZolin (ANCEF) IVPB 2g/100 mL premix        2 g 200 mL/hr over 30 Minutes Intravenous On call to O.R. 04/30/21 0736 04/30/21 0930   04/30/21 0737  ceFAZolin (ANCEF) 2-4 GM/100ML-% IVPB       Note to Pharmacy: Cyndia Diver R: cabinet override      04/30/21 0737 04/30/21 1944      Subjective  The patient is resting. He continues to have pain.   Objective   Vitals:  Vitals:   05/01/21 0732 05/01/21 1644  BP: 140/83 (!) 160/78  Pulse: 74 82  Resp: 16 16  Temp: 98.4 F (36.9 C) 99.1 F (37.3 C)  SpO2: 94% 97%    Exam:  Constitutional:  The patient is awake, alert, and oriented x 3. Mild distress from pain. Respiratory:  No increased work of breathing. No wheezes, rales,  or rhonchi No tactile fremitus Cardiovascular:  Regular rate and rhythm No murmurs, ectopy, or gallups. No lateral PMI. No thrills. Abdomen:  Abdomen is soft, non-tender, non-distended No hernias, masses, or organomegaly Normoactive bowel sounds.  Musculoskeletal:  No cyanosis, clubbing, or edema Skin:  No rashes, lesions, ulcers palpation of skin: no induration or nodules Psychiatric:  Mental status Mood, affect appropriate Orientation to person, place, time  judgment and insight appear intact   I have personally reviewed the following:   Today's Data  Vitals  Lab Data  CBC CMP  Micro Data    Imaging  X-ray  Chest X-ray Femur  X-ray pelvis MRI Right knee MRI Left Knee  Cardiology Data  EKG  Scheduled Meds:  apixaban  2.5 mg Oral BID   docusate sodium  100 mg Oral BID   predniSONE  10 mg Oral Q breakfast   Continuous Infusions:   sodium bicarbonate (isotonic) infusion in sterile water 100 mL/hr at 05/01/21 1529    Principal Problem:   Quadriceps muscle rupture Active Problems:   Myasthenia gravis (HCC)   Hyperlipidemia with target LDL less than 130   Erectile dysfunction   Elevated CK   Prolonged QT interval   LOS: 2 days   A & P  Principal Problem:  bilateral Quadriceps muscle rupture 55 year old male presenting to ED after mechanical fall with subsequent bilateral knee pain and inability to bear weight found to have bilateral ruptured quadriceps -admit to medical telemetry -ortho consulted, likely OR tomorrow. Knee immobilizers bilaterally  -pain control with norco and dilaudid -IVF fluids changed from NS to sodium bicarbonate. -plan per ortho   Active Problems:   Elevated CK Acquired due to trauma from mechanical fall  CK to 656 secondary to above on admission. Up to 1800 today.IV fluids have been changed to sodium bicarbonate  at 100cc/hour to aid in the excretion of myoglobin. Monitor CK and creatinine. Repeat CK and BMP in AM     Prolonged QT interval- Resolved. New since ekg in 2019, possibly secondary to elevated CK Check magnesium, other electrolytes wnl Telemetry x 24 hours Repeat ekg in AM Avoid qt prolonging drugs     Myasthenia gravis (HCC) No signs of exacerbation at this time. The patient states that his symptoms are primarily ocular. He denies any signs of a flare. Continue daily prednisone at 10mg /daily     Erectile dysfunction Hold cialis while inpatient      Hyperlipidemia with target LDL less than 130 Diet controlled   There is no height or weight on file to calculate BMI.  I have seen and examined this patient myself I have spent 32  minutes in his evaluation and care.   Level of care: Telemetry Medical DVT prophylaxis:  TED hose  Code Status:  Full - confirmed with patient/family Family Communication: wife at bedside:  Disposition Plan:  The patient is from: home             Anticipated d/c is to: tbd               Requires inpatient hospitalization and is at significant risk of worsening, requires constant monitoring, assessment/intervention, IV pain control and MDM with specialists.      Janisse Ghan, DO [facility] Direct contact: see [email or web address]  7PM-7AM contact night coverage as above 05/01/2021, 6:54 PM  LOS: 1 day

## 2021-05-01 NOTE — Plan of Care (Signed)
°  Problem: Clinical Measurements: Goal: Will remain free from infection Outcome: Progressing   Problem: Elimination: Goal: Will not experience complications related to bowel motility Outcome: Progressing   Problem: Coping: Goal: Level of anxiety will decrease Outcome: Progressing

## 2021-05-02 LAB — CBC WITH DIFFERENTIAL/PLATELET
Abs Immature Granulocytes: 0.05 10*3/uL (ref 0.00–0.07)
Basophils Absolute: 0 10*3/uL (ref 0.0–0.1)
Basophils Relative: 0 %
Eosinophils Absolute: 0 10*3/uL (ref 0.0–0.5)
Eosinophils Relative: 0 %
HCT: 43.2 % (ref 39.0–52.0)
Hemoglobin: 15.1 g/dL (ref 13.0–17.0)
Immature Granulocytes: 0 %
Lymphocytes Relative: 13 %
Lymphs Abs: 1.6 10*3/uL (ref 0.7–4.0)
MCH: 32.7 pg (ref 26.0–34.0)
MCHC: 35 g/dL (ref 30.0–36.0)
MCV: 93.5 fL (ref 80.0–100.0)
Monocytes Absolute: 2 10*3/uL — ABNORMAL HIGH (ref 0.1–1.0)
Monocytes Relative: 17 %
Neutro Abs: 8.1 10*3/uL — ABNORMAL HIGH (ref 1.7–7.7)
Neutrophils Relative %: 70 %
Platelets: 189 10*3/uL (ref 150–400)
RBC: 4.62 MIL/uL (ref 4.22–5.81)
RDW: 12.8 % (ref 11.5–15.5)
WBC: 11.7 10*3/uL — ABNORMAL HIGH (ref 4.0–10.5)
nRBC: 0 % (ref 0.0–0.2)

## 2021-05-02 LAB — GLUCOSE, CAPILLARY
Glucose-Capillary: 132 mg/dL — ABNORMAL HIGH (ref 70–99)
Glucose-Capillary: 151 mg/dL — ABNORMAL HIGH (ref 70–99)

## 2021-05-02 LAB — BASIC METABOLIC PANEL
Anion gap: 11 (ref 5–15)
Anion gap: 9 (ref 5–15)
BUN: 7 mg/dL (ref 6–20)
BUN: 9 mg/dL (ref 6–20)
CO2: 23 mmol/L (ref 22–32)
CO2: 26 mmol/L (ref 22–32)
Calcium: 8.5 mg/dL — ABNORMAL LOW (ref 8.9–10.3)
Calcium: 8.7 mg/dL — ABNORMAL LOW (ref 8.9–10.3)
Chloride: 99 mmol/L (ref 98–111)
Chloride: 98 mmol/L (ref 98–111)
Creatinine, Ser: 0.95 mg/dL (ref 0.61–1.24)
Creatinine, Ser: 0.92 mg/dL (ref 0.61–1.24)
GFR, Estimated: >60 mL/min (ref >60)
GFR, Estimated: >60 mL/min (ref >60)
Glucose, Bld: 148 mg/dL — ABNORMAL HIGH (ref 70–99)
Glucose, Bld: 166 mg/dL — ABNORMAL HIGH (ref 70–99)
Potassium: 3.5 mmol/L (ref 3.5–5.1)
Potassium: 3.5 mmol/L (ref 3.5–5.1)
Sodium: 133 mmol/L — ABNORMAL LOW (ref 135–145)
Sodium: 133 mmol/L — ABNORMAL LOW (ref 135–145)

## 2021-05-02 LAB — CK: Total CK: 1469 U/L — ABNORMAL HIGH (ref 49–397)

## 2021-05-02 NOTE — Progress Notes (Signed)
PROGRESS NOTE  Jay Rosario [id number]:[id number] DOB: 12/06/1965 DOA: 04/28/2021 PCP: Ronnald Nian, MD  Brief History   Jay Rosario is a 55 y.o. male with medical history significant of Myasthenia gravis on chronic steroids, HLD presenting to ED with mechanical fall and subsequent bilateral knee pain. He was at a friends house and walking down some steps looking at his phone. He thought he was done with the steps and apparently fell stepping down. He doesn't remember how he fell, but states it was the worse pain of his life. Pain was 10/10 and felt like someone was tearing his legs apart. No radiation. No loss of sensation down the legs. He was immediately unable to bear weight and his wife called 911. He can move the legs side to side, but that is all. He continues to have excruciating pain.      No fever/chills, headaches/vision changes, chest pain or palpitations, no shortness of breath. He has had a mild cough a week ago, but is better and is dry in nature, no stomach pain/N/V/D, no leg swelling or dysuria.    He has a history of MG that he is not followed by neurology any longer. Has history of flairs in the past, but states it only affects his eyes. He denies any visual changes, double vision, blurry vision or ptosis.    ED Course: vitals: afebrile later on tmax of 100.7, bp: 137/85, HR: 73, RR: 20, oxygen 98% RA Pertinent labs: lactic acid 2.1>1.5, CK: 645, UA: 30 protein,  MRI left knee: high grade complete or near complete rupture of the distal quadriceps tendon. Intrasubstance degeneration of the medial meniscus without discrete tear.  MRI right knee: high grade, near complete tear of the quadriceps tendon at the patellar insertion. Small joint effusion and extensive adjacent soft tissue swelling. Nondisplaced horizontal tear of the anterior horn and body of the lateral meniscus with adjacent parameniscal cyst formation. In ED: given pain medication, ortho was consulted and TRH was  asked to admit.   CK has increased to 1800. Will change IV lfuids from NS to sodium bicarbonate. Monitor electrolytes, creatinine, and CK.  The patient continues to complain of pain.   Consultants  Orthopedic surgery  Procedures  Right quadriceps tendon repair Left quadriceps tendon repair  Antibiotics   Anti-infectives (From admission, onward)    Start     Dose/Rate Route Frequency Ordered Stop   04/30/21 1630  ceFAZolin (ANCEF) IVPB 2g/100 mL premix        2 g 200 mL/hr over 30 Minutes Intravenous Every 6 hours 04/30/21 1510 05/01/21 0454   04/30/21 0745  ceFAZolin (ANCEF) IVPB 2g/100 mL premix        2 g 200 mL/hr over 30 Minutes Intravenous On call to O.R. 04/30/21 0736 04/30/21 0930   04/30/21 0737  ceFAZolin (ANCEF) 2-4 GM/100ML-% IVPB       Note to Pharmacy: Cyndia Diver R: cabinet override      04/30/21 0737 04/30/21 1944      Subjective  The patient is resting. He continues to have pain.   Objective   Vitals:  Vitals:   05/01/21 1644 05/02/21 1149  BP: (!) 160/78 138/86  Pulse: 82 93  Resp: 16 18  Temp: 99.1 F (37.3 C) 98.3 F (36.8 C)  SpO2: 97% 100%    Exam:  Constitutional:  The patient is awake, alert, and oriented x 3. Mild distress from pain. Respiratory:  No increased work of breathing. No wheezes, rales,  or rhonchi No tactile fremitus Cardiovascular:  Regular rate and rhythm No murmurs, ectopy, or gallups. No lateral PMI. No thrills. Abdomen:  Abdomen is soft, non-tender, non-distended No hernias, masses, or organomegaly Normoactive bowel sounds.  Musculoskeletal:  No cyanosis, clubbing, or edema Skin:  No rashes, lesions, ulcers palpation of skin: no induration or nodules Psychiatric:  Mental status Mood, affect appropriate Orientation to person, place, time  judgment and insight appear intact   I have personally reviewed the following:   Today's Data  Vitals  Lab Data  CBC CMP  Micro Data    Imaging  X-ray  Chest X-ray Femur  X-ray pelvis MRI Right knee MRI Left Knee  Cardiology Data  EKG  Scheduled Meds:  apixaban  2.5 mg Oral BID   docusate sodium  100 mg Oral BID   predniSONE  10 mg Oral Q breakfast   Continuous Infusions:   sodium bicarbonate (isotonic) infusion in sterile water 100 mL/hr at 05/02/21 1614    Principal Problem:   Quadriceps muscle rupture Active Problems:   Myasthenia gravis (HCC)   Hyperlipidemia with target LDL less than 130   Erectile dysfunction   Elevated CK   Prolonged QT interval   LOS: 3 days   A & P  Principal Problem:  bilateral Quadriceps muscle rupture 55 year old male presenting to ED after mechanical fall with subsequent bilateral knee pain and inability to bear weight found to have bilateral ruptured quadriceps -admit to medical telemetry -ortho consulted, likely OR tomorrow. Knee immobilizers bilaterally  -pain control with norco and dilaudid -IVF fluids changed from NS to sodium bicarbonate. -plan per ortho   Active Problems:   Elevated CK Acquired due to trauma from mechanical fall  CK to 656 secondary to above on admission. Down to 1469 today with IV sodium bicarbonate. Creatinine is minimally increased to 0.94 from 0.85. Monitor CK and creatinine. Repeat CK and BMP in AM.     Prolonged QT interval- Resolved. New since ekg in 2019, possibly secondary to elevated CK Check magnesium, other electrolytes wnl Telemetry x 24 hours Repeat ekg in AM Avoid qt prolonging drugs     Myasthenia gravis (HCC) No signs of exacerbation at this time. The patient states that his symptoms are primarily ocular. He denies any signs of a flare. Continue daily prednisone at 10mg /daily     Erectile dysfunction Hold cialis while inpatient      Hyperlipidemia with target LDL less than 130 Diet controlled   There is no height or weight on file to calculate BMI.  I have seen and examined this patient myself I have spent 34 minutes in his  evaluation and care.   Level of care: Telemetry Medical DVT prophylaxis:  TED hose  Code Status:  Full - confirmed with patient/family Family Communication: wife at bedside:  Disposition Plan:  The patient is from: home             Anticipated d/c is to: tbd               Requires inpatient hospitalization and is at significant risk of worsening, requires constant monitoring, assessment/intervention, IV pain control and MDM with specialists.     Matther Labell, DO [facility] Direct contact: see [email or web address]  7PM-7AM contact night coverage as above 05/02/2021, 7:02 PM  LOS: 1 day

## 2021-05-02 NOTE — Evaluation (Signed)
Physical Therapy Evaluation Patient Details Name: Jay Rosario MRN: [id number] DOB: 07/21/1965 Today's Date: 05/02/2021  History of Present Illness  55 yo male presenting to ED on 12/11 with mechnical fall and bilateral knee pain. MRI showing bilateral quadriceps tendon rupture. S/p bilateral quadriceps tendon repair on 12/13. PMH including myasthenia gravis and HLD.  Clinical Impression  Pt was seen for mobility to get to bed from a recliner, using wife for training purposes.  Pt is able to scoot with UE's and to be assisted with LE's using bed pad, and with cues pivoted on the bed.  Talked with wife about the fact that legs across the bed would reach the edge and that would require helping pt to pivot to avoid stressing knees.  Follow along with him to get practice in for sliding laterally as well, and to work toward more independence with wife requiring less help to move him as time progresses.  Pt may be able to  update his leg splints to a more secure version that will allow him to manage with less support on the legs.  Follow for acute PT goals as are outlined below.      Recommendations for follow up therapy are one component of a multi-disciplinary discharge planning process, led by the attending physician.  Recommendations may be updated based on patient status, additional functional criteria and insurance authorization.  Follow Up Recommendations Home health PT    Assistance Recommended at Discharge Intermittent Supervision/Assistance  Functional Status Assessment Patient has had a recent decline in their functional status and demonstrates the ability to make significant improvements in function in a reasonable and predictable amount of time.  Equipment Recommendations  BSC/3in1;Wheelchair (measurements PT);Wheelchair cushion (measurements PT);Hospital bed (drop arm BSC)    Recommendations for Other Services       Precautions / Restrictions Precautions Precautions:  Fall Precaution Comments: NWB LE's and immobilizers at all times Restrictions Weight Bearing Restrictions: Yes RLE Weight Bearing: Non weight bearing LLE Weight Bearing: Non weight bearing Other Position/Activity Restrictions: bilateral knee immobilizers on at all times      Mobility  Bed Mobility Overal bed mobility: Needs Assistance             General bed mobility comments: supervision for management of braces and to assist wiht set up to return to bed    Transfers Overall transfer level: Needs assistance Equipment used: 2 person hand held assist Transfers: Bed to chair/wheelchair/BSC         Anterior-Posterior transfers: Min assist;+2 safety/equipment;+2 physical assistance   General transfer comment: min to manage legs and min for scooting to bed    Ambulation/Gait               General Gait Details: unable due to NWB BLE's  Stairs            Wheelchair Mobility    Modified Rankin (Stroke Patients Only)       Balance Overall balance assessment: Needs assistance Sitting-balance support: Single extremity supported Sitting balance-Leahy Scale: Good                                       Pertinent Vitals/Pain Pain Assessment: Faces Pain Score: 5  Faces Pain Scale: Hurts little more Pain Location: B knees with movement Pain Descriptors / Indicators: Discomfort;Guarding Pain Intervention(s): Limited activity within patient's tolerance;Monitored during session;Premedicated before session;Repositioned    Home Living  Family/patient expects to be discharged to:: Private residence Living Arrangements: Spouse/significant other;Children Available Help at Discharge: Family;Available 24 hours/day Type of Home: House Home Access: Stairs to enter   [employer] of Steps: 3+1+1 Alternate Level Stairs-Number of Steps: Flight Home Layout: Two level;1/2 bath on main level Home Equipment: None      Prior Function Prior  Level of Function : Independent/Modified Independent;Working/employed;Driving                     Hand Dominance        Extremity/Trunk Assessment   Upper Extremity Assessment Upper Extremity Assessment: Defer to OT evaluation    Lower Extremity Assessment Lower Extremity Assessment: RLE deficits/detail;LLE deficits/detail RLE Deficits / Details: B quad tendon repair with no ROM to knees permitted, NWB LLE Deficits / Details: bilateral quadriceps tendon rupture. S/p bilateral quadriceps tendon repair on 12/13. maintain KI and no ROM    Cervical / Trunk Assessment Cervical / Trunk Assessment: Normal  Communication   Communication: No difficulties  Cognition Arousal/Alertness: Awake/alert Behavior During Therapy: WFL for tasks assessed/performed Overall Cognitive Status: Within Functional Limits for tasks assessed                                          General Comments General comments (skin integrity, edema, etc.): wife was instructed on transition to bed by using bed pad and helping with LE's    Exercises     Assessment/Plan    PT Assessment Patient needs continued PT services  PT Problem List Decreased strength;Decreased range of motion;Decreased activity tolerance;Decreased balance;Decreased mobility;Decreased skin integrity;Pain       PT Treatment Interventions DME instruction;Functional mobility training;Therapeutic activities;Therapeutic exercise;Balance training;Neuromuscular re-education;Patient/family education    PT Goals (Current goals can be found in the Care Plan section)  Acute Rehab PT Goals Patient Stated Goal: to get independent to move to and from bed PT Goal Formulation: With patient/family Time For Goal Achievement: 05/09/21 Potential to Achieve Goals: Good    Frequency Min 5X/week   Barriers to discharge Inaccessible home environment stairs to enter house    Co-evaluation               AM-PAC PT "6  Clicks" Mobility  Outcome Measure Help needed turning from your back to your side while in a flat bed without using bedrails?: A Little Help needed moving from lying on your back to sitting on the side of a flat bed without using bedrails?: A Little Help needed moving to and from a bed to a chair (including a wheelchair)?: A Lot Help needed standing up from a chair using your arms (e.g., wheelchair or bedside chair)?: Total Help needed to walk in hospital room?: Total Help needed climbing 3-5 steps with a railing? : Total 6 Click Score: 11    End of Session Equipment Utilized During Treatment: Gait belt Activity Tolerance: Patient limited by fatigue;Patient limited by pain Patient left: in bed;with call bell/phone within reach;with bed alarm set Rosario Communication: Mobility status PT Visit Diagnosis: Muscle weakness (generalized) (M62.81);Pain Pain - Right/Left:  (bilateral) Pain - part of body: Knee    Time: 1325-1344 (+3235-5732) PT Time Calculation (min) (ACUTE ONLY): 19 min   Charges:   PT Evaluation $PT Eval Moderate Complexity: 1 Mod PT Treatments $Therapeutic Activity: 8-22 mins       Ivar Drape 05/02/2021, 3:07 PM  Windell Moulding  Lorenda Cahill, PT PhD Acute Rehab Dept. Number: [place] [facility] [place]

## 2021-05-02 NOTE — Evaluation (Signed)
Occupational Therapy Evaluation Patient Details Name: Jay Rosario MRN: [id number] DOB: [birth year] Today's Date: 05/02/2021   History of Present Illness 55 yo male presenting to ED on 12/11 with mechnical fall and bilateral knee pain. MRI showing bilateral quadriceps tendon rupture. S/p bilateral quadriceps tendon repair on 12/13. PMH including myasthenia gravis and HLD.   Clinical Impression   PTA, pt was living with his wife and was independent. Currently requiring Max A for LB ADLs and Min A for anterior/posterior transfer to recliner. Pt with limited ROM and activity tolerance; however, very motivated to participate in therapy.  Initiating education on compensatory techniques for LB dressing, LB bathing, and toileting. Discussing possible need for ramp for in/out of home due to three steps at entrance. Pt will require further acute OT to address LB ADLs and functional transfers. Recommend dc to home once medically stable per physician.      Recommendations for follow up therapy are one component of a multi-disciplinary discharge planning process, led by the attending physician.  Recommendations may be updated based on patient status, additional functional criteria and insurance authorization.   Follow Up Recommendations  No OT follow up    Assistance Recommended at Discharge Intermittent Supervision/Assistance  Functional Status Assessment  Patient has had a recent decline in their functional status and demonstrates the ability to make significant improvements in function in a reasonable and predictable amount of time.  Equipment Recommendations  Wheelchair (measurements OT);Wheelchair cushion (measurements OT) (BSC with drop arms; bilateral leg rests for w/c; hospital bed)    Recommendations for Other Services PT consult     Precautions / Restrictions Precautions Precautions: Fall Restrictions Weight Bearing Restrictions: Yes RLE Weight Bearing: Non weight bearing LLE  Weight Bearing: Non weight bearing Other Position/Activity Restrictions: bilateral knee immobilizers on at all times      Mobility Bed Mobility Overal bed mobility: Modified Independent                  Transfers Overall transfer level: Needs assistance Equipment used: None Transfers: Bed to chair/wheelchair/BSC         Anterior-Posterior transfers: Min assist   General transfer comment: Min A for managing BLEs      Balance                                           ADL either performed or assessed with clinical judgement   ADL Overall ADL's : Needs assistance/impaired Eating/Feeding: Set up;Sitting   Grooming: Set up;Sitting   Upper Body Bathing: Set up;Supervision/ safety;Sitting   Lower Body Bathing: Maximal assistance;Sitting/lateral leans   Upper Body Dressing : Set up;Supervision/safety;Sitting   Lower Body Dressing: Maximal assistance;Sitting/lateral leans   Toilet Transfer: Minimal assistance;Anterior/posterior (simulated to recliner) Statistician Details (indicate cue type and reason): Min A for managing BLEs to reduce pain         Functional mobility during ADLs: Minimal assistance (anterior/posterior) General ADL Comments: Providing education on compensatory technqiues for dressing, bathing, and toileting with use of lateral leans.     Vision         Perception     Praxis      Pertinent Vitals/Pain Pain Assessment: 0-10 Pain Score: 5  Pain Location: bilateral knees Pain Descriptors / Indicators: Constant;Discomfort;Grimacing Pain Intervention(s): Monitored during session;Limited activity within patient's tolerance;Repositioned     Hand Dominance     Extremity/Trunk Assessment  Upper Extremity Assessment Upper Extremity Assessment: Overall WFL for tasks assessed   Lower Extremity Assessment Lower Extremity Assessment: RLE deficits/detail;LLE deficits/detail RLE Deficits / Details: bilateral quadriceps  tendon rupture. S/p bilateral quadriceps tendon repair on 12/13. maintain KI and no ROM LLE Deficits / Details: bilateral quadriceps tendon rupture. S/p bilateral quadriceps tendon repair on 12/13. maintain KI and no ROM   Cervical / Trunk Assessment Cervical / Trunk Assessment: Normal   Communication Communication Communication: No difficulties   Cognition Arousal/Alertness: Awake/alert Behavior During Therapy: WFL for tasks assessed/performed Overall Cognitive Status: Within Functional Limits for tasks assessed                                       General Comments  Wife present throughout and very supportive    Exercises     Shoulder Instructions      Home Living Family/patient expects to be discharged to:: Private residence Living Arrangements: Spouse/significant other;Children Available Help at Discharge: Family;Available 24 hours/day Type of Home: House Home Access: Stairs to enter [employer] of Steps: 3   Home Layout: Two level;1/2 bath on main level Alternate Level Stairs-Number of Steps: Flight   Bathroom Shower/Tub: Producer, television/film/video: Standard     Home Equipment: None          Prior Functioning/Environment Prior Level of Function : Independent/Modified Independent;Working/employed;Driving                        OT Problem List: Decreased range of motion;Decreased activity tolerance;Impaired balance (sitting and/or standing);Decreased knowledge of precautions;Decreased knowledge of use of DME or AE;Pain      OT Treatment/Interventions: Self-care/ADL training;Therapeutic exercise;Energy conservation;DME and/or AE instruction;Therapeutic activities;Patient/family education    OT Goals(Current goals can be found in the care plan section) Acute Rehab OT Goals Patient Stated Goal: Return home OT Goal Formulation: With patient/family Time For Goal Achievement: 05/16/21 Potential to Achieve Goals: Good  OT  Frequency: Min 3X/week   Barriers to D/C: Inaccessible home environment  three steps to enter home       Co-evaluation              AM-PAC OT "6 Clicks" Daily Activity     Outcome Measure Help from another person eating meals?: None Help from another person taking care of personal grooming?: None Help from another person toileting, which includes using toliet, bedpan, or urinal?: A Lot Help from another person bathing (including washing, rinsing, drying)?: A Lot Help from another person to put on and taking off regular upper body clothing?: None Help from another person to put on and taking off regular lower body clothing?: A Lot 6 Click Score: 18   End of Session Equipment Utilized During Treatment: Right knee immobilizer;Left knee immobilizer Nurse Communication: Mobility status  Activity Tolerance: Patient tolerated treatment well Patient left: in chair;with call bell/phone within reach;with family/visitor present  OT Visit Diagnosis: Unsteadiness on feet (R26.81);Other abnormalities of gait and mobility (R26.89);Muscle weakness (generalized) (M62.81);Pain Pain - Right/Left:  (bil) Pain - part of body: Knee                Time: 0932-3557 OT Time Calculation (min): 33 min Charges:  OT General Charges $OT Visit: 1 Visit OT Evaluation $OT Eval Moderate Complexity: 1 Mod OT Treatments $Self Care/Home Management : 8-22 mins  Khalise Billard MSOT, OTR/L Acute Rehab Pager: [phone]  Office: [phone]  Theodoro Grist Margalit Leece 05/02/2021, 1:09 PM

## 2021-05-02 NOTE — Progress Notes (Signed)
Orthopaedics Daily Progress Note   05/02/2021   4:52 PM  Jay Rosario is a 55 y.o. male 2 Days Post-Op s/p QUAD TENDON REPAIR  Subjective Denies nausea, vomiting, or fevers. Pain well controlled.  Looking forward to getting home.  Objective Vitals:   05/01/21 1644 05/02/21 1149  BP: (!) 160/78 138/86  Pulse: 82 93  Resp: 16 18  Temp: 99.1 F (37.3 C) 98.3 F (36.8 C)  SpO2: 97% 100%    Intake/Output Summary (Last 24 hours) at 05/02/2021 1652 Last data filed at 05/02/2021 1618 Gross per 24 hour  Intake 2684.7 ml  Output 1725 ml  Net 959.7 ml    Physical Exam BLE: Dressing clean, dry, and intact and KI in place +DF/PF/EHL SILT SP/DP/T +DP/PT and WWP distally  Assessment 55 y.o. male s/p Procedure(s) (LRB): QUAD TENDON REPAIR (Bilateral)  Plan Mobility: Will require wheelchair for 2 weeks while nonweightbearing on bilateral lower extremities. Pain control: Continue to wean/titrate to appropriate oral regimen DVT Prophylaxis: Eliquis 2.5 mg p.o. twice daily x6 weeks Further surgical plans: None RUE: No restrictions LUE: No restrictions RLE: Nonweightbearing, knee immobilizer at all times except hygiene LLE: Nonweightbearing, knee immobilizer at all times except hygiene Disposition: Appropriate for discharge from orthopedic standpoint Dressing care: Keep Mepilex on and dry for 5 days.  Do not allow surgical area to get wet before that.  Remove Mepilex dressing after 5 days and allow area to get wet in shower but DO NOT SUBMERGE. Follow-up: Please call [facility] [phone]) to schedule follow-op appointment for 2 weeks after surgery.  I verified that my discharge instructions and follow-up information is entered in the Discharge Navigator in Epic.  These should automatically populate in the AVS.  Please print the AVS in its entirety and ensure that the patient or a responsible party has a complete copy of the AVS before they are  discharged.  If there are questions regarding discharge instructions or follow-up before the AVS is generated, please check the Discharge Navigator before contacting the office.  If unsure how to access the Discharge Navigator or the information contained in the Discharge Navigator, or how to generate/print the AVS, please contact the appropriate Proofreader.     PLEASE GIVE PATIENT AVS AND PAPER POSTOP INSTRUCTIONS WHICH ARE IN THE DRAWER OUTSIDE HIS ROOM (VERIFIED 05/02/21 4:54 PM)!   Ernestina Columbia M.D. Orthopaedic Surgery [facility]

## 2021-05-02 NOTE — Care Management (Signed)
°  °  Durable Medical Equipment  (From admission, onward)           Start     Ordered   05/02/21 1349  For home use only DME standard manual wheelchair with seat cushion  Once       Comments: Patient suffers from  Right quadriceps tendon repair and  Left quadriceps tendon repair   which impairs their ability to perform daily activities like ambulating  in the home.  A cane  will not resolve issue with performing activities of daily living. A wheelchair will allow patient to safely perform daily activities. Patient can safely propel the wheelchair in the home or has a caregiver who can provide assistance. Length of need 2 weeks Accessories: elevating leg rests (ELRs), wheel locks, extensions and anti-tippers.  Seat and back cushions   wheelchair with bilateral leg rests and drop arms   Call Jay Rosario (Spouse)   Showing 1 of 1   [phone]     For delivery Thanks   05/02/21 1349   05/02/21 1344  For home use only DME Bedside commode  Once       Comments: drop arm BSC.   Call wife for delivery Thanks  Question:  Patient needs a bedside commode to treat with the following condition  Answer:  Weakness   05/02/21 1345   05/02/21 1338  For home use only DME Hospital bed  Once       Comments: call wife  Jay Rosario (Spouse)       [phone]    For delivery   Thanks  Question Answer Comment  Length of Need Lifetime   Patient has (list medical condition): Right quadriceps tendon repair, left quadriceps tendon repair   The above medical condition requires: Patient requires the ability to reposition frequently   Head must be elevated greater than: 45 degrees   Bed type Semi-electric   Support Surface: Gel Overlay      05/02/21 1339

## 2021-05-02 NOTE — Progress Notes (Signed)
PT Cancellation Note  Patient Details Name: Jay Rosario MRN: [id number] DOB: 02-09-1966   Cancelled Treatment:    Reason Eval/Treat Not Completed: Other (comment).  Pt is getting OOB with OT and will return later to see pt.   Ivar Drape 05/02/2021, 11:25 AM  Samul Dada, PT PhD Acute Rehab Dept. Number: [facility] [id number] and [facility] [phone]

## 2021-05-02 NOTE — TOC Progression Note (Signed)
Transition of Care [facility]) - Progression Note    Patient Details  Name: Jay Rosario MRN: [id number] Date of Birth: Jun 04, 1965  Transition of Care [facility]) CM/SW Contact  Nadene Rubins Adria Devon, RN Phone Number: 05/02/2021, 2:13 PM  Clinical Narrative:      Ordered DME with Velna Hatchet with [employer]. [employer] will call [employer]inaldo Cloud for delivery. DME will need to be in place prior to patient discharging home.   Spoke to PT in hallway recommending ramp and HHPT.   Velna Hatchet with [employer] will someone from [employer] call [facility] regarding ramp rental and set up.   Stacie with [facility] reviewing clinicals for HHPT . Patient currently has [facility] , at first of the year he will have [employer].   Patient and Rinaldo Cloud aware of all of above.       Expected Discharge Plan and Services     Discharge Planning Services: CM Consult   Living arrangements for the past 2 months: Single Family Home                                       Social Determinants of Health (SDOH) Interventions    Readmission Risk Interventions No flowsheet data found.

## 2021-05-03 LAB — BASIC METABOLIC PANEL
Anion gap: 7 (ref 5–15)
BUN: 12 mg/dL (ref 6–20)
CO2: 26 mmol/L (ref 22–32)
Calcium: 8.3 mg/dL — ABNORMAL LOW (ref 8.9–10.3)
Chloride: 97 mmol/L — ABNORMAL LOW (ref 98–111)
Creatinine, Ser: 0.86 mg/dL (ref 0.61–1.24)
GFR, Estimated: >60 mL/min (ref >60)
Glucose, Bld: 170 mg/dL — ABNORMAL HIGH (ref 70–99)
Potassium: 3.5 mmol/L (ref 3.5–5.1)
Sodium: 130 mmol/L — ABNORMAL LOW (ref 135–145)

## 2021-05-03 LAB — CK: Total CK: 606 U/L — ABNORMAL HIGH (ref 49–397)

## 2021-05-03 MED ORDER — SODIUM CHLORIDE 0.9 % IV SOLN: INTRAVENOUS | Status: DC

## 2021-05-03 NOTE — Progress Notes (Signed)
Occupational Therapy Treatment Patient Details Name: Jay Rosario MRN: [id number] DOB: [birth year] Today's Date: 05/03/2021   History of present illness 55 yo male presenting to ED on 12/11 with mechnical fall and bilateral knee pain. MRI showing bilateral quadriceps tendon rupture. S/p bilateral quadriceps tendon repair on 12/13. PMH including myasthenia gravis and HLD.   OT comments  Pt is progressing towards OT goals. During session, educated on AE use and compensatory strategies for LB dressing. Pt completing LB dressing with Mod A (see details below). Will continue to follow acutely.   Recommendations for follow up therapy are one component of a multi-disciplinary discharge planning process, led by the attending physician.  Recommendations may be updated based on patient status, additional functional criteria and insurance authorization.    Follow Up Recommendations  No OT follow up    Assistance Recommended at Discharge Intermittent Supervision/Assistance  Equipment Recommendations  Wheelchair (measurements OT);Wheelchair cushion (measurements OT)    Recommendations for Other Services      Precautions / Restrictions Precautions Precautions: Fall Precaution Comments: NWB LE's and immobilizers at all times Required Braces or Orthoses: Knee Immobilizer - Right;Knee Immobilizer - Left Knee Immobilizer - Right: On at all times Knee Immobilizer - Left: On at all times Restrictions Weight Bearing Restrictions: Yes RLE Weight Bearing: Non weight bearing LLE Weight Bearing: Non weight bearing       Mobility Bed Mobility      General bed mobility comments: In recliner on arrival    Transfers  General transfer comment: Did not attempt during session     Balance Overall balance assessment: Needs assistance Sitting-balance support: Single extremity supported Sitting balance-Leahy Scale: Good                                     ADL either performed or  assessed with clinical judgement   ADL Overall ADL's : Needs assistance/impaired                     Lower Body Dressing: Moderate assistance;With adaptive equipment;Sitting/lateral leans Lower Body Dressing Details (indicate cue type and reason): Donned underwear using reacher. Required assistance to get over knee immobilizers. Would likely be able to complete without assistance with looser-fitting clothing. Donned socks with sock aid in long sitting with Mod A to position device on foot and lift legs.                    Extremity/Trunk Assessment              Vision       Perception     Praxis      Cognition Arousal/Alertness: Awake/alert Behavior During Therapy: WFL for tasks assessed/performed Overall Cognitive Status: Within Functional Limits for tasks assessed                                            Exercises     Shoulder Instructions       General Comments     Pertinent Vitals/ Pain       Pain Assessment: No/denies pain Faces Pain Scale: Hurts a little bit Pain Location: B knees with movement to transfer Pain Descriptors / Indicators: Guarding Pain Intervention(s): Monitored during session;Repositioned  Home Living  Prior Functioning/Environment              Frequency  Min 3X/week        Progress Toward Goals  OT Goals(current goals can now be found in the care plan section)  Progress towards OT goals: Progressing toward goals  Acute Rehab OT Goals Patient Stated Goal: return home OT Goal Formulation: With patient Time For Goal Achievement: 05/16/21 Potential to Achieve Goals: Good ADL Goals Pt Will Perform Lower Body Dressing: with modified independence;with caregiver independent in assisting;with adaptive equipment;sitting/lateral leans;bed level Pt Will Transfer to Toilet: with modified independence;anterior/posterior transfer;with transfer  board;bedside commode Pt Will Perform Toileting - Clothing Manipulation and hygiene: with modified independence;sitting/lateral leans  Plan Discharge plan remains appropriate;Frequency remains appropriate    Co-evaluation                 AM-PAC OT "6 Clicks" Daily Activity     Outcome Measure   Help from another person eating meals?: None Help from another person taking care of personal grooming?: None Help from another person toileting, which includes using toliet, bedpan, or urinal?: A Lot Help from another person bathing (including washing, rinsing, drying)?: A Lot Help from another person to put on and taking off regular upper body clothing?: None Help from another person to put on and taking off regular lower body clothing?: A Lot 6 Click Score: 18    End of Session Equipment Utilized During Treatment: Right knee immobilizer;Left knee immobilizer  OT Visit Diagnosis: Unsteadiness on feet (R26.81);Other abnormalities of gait and mobility (R26.89);Muscle weakness (generalized) (M62.81);Pain   Activity Tolerance Patient tolerated treatment well   Patient Left in chair;with call bell/phone within reach   Nurse Communication Mobility status        Time: 7425-9563 OT Time Calculation (min): 20 min  Charges: OT General Charges $OT Visit: 1 Visit OT Treatments $Self Care/Home Management : 8-22 mins  Filicia Scogin C, OT/L  Acute Rehab [phone]  Lenice Llamas 05/03/2021, 5:06 PM

## 2021-05-03 NOTE — Progress Notes (Signed)
Physical Therapy Treatment Patient Details Name: Jay Rosario MRN: [id number] DOB: 1965/09/28 Today's Date: 05/03/2021   History of Present Illness 55 yo male presenting to ED on 12/11 with mechnical fall and bilateral knee pain. MRI showing bilateral quadriceps tendon rupture. S/p bilateral quadriceps tendon repair on 12/13. PMH including myasthenia gravis and HLD.    PT Comments    Pt was assisted to chair with help to move BLE's only, drop arm recliner was locked and pt was able to scoot laterally with trunk.  Pt is safe to make the transition to home, will need help with EMT's to get in on steps but otherwise will be safely able to transfer and move himself in a chair.  Follow acutely as his stay permits, and pt is anticipating a dc home today.  Focus on his use of transfers with monitoring of LE safety and avoiding stressing the quad tendon repair locations.   Recommendations for follow up therapy are one component of a multi-disciplinary discharge planning process, led by the attending physician.  Recommendations may be updated based on patient status, additional functional criteria and insurance authorization.  Follow Up Recommendations  Home health PT     Assistance Recommended at Discharge Intermittent Supervision/Assistance  Equipment Recommendations  BSC/3in1;Wheelchair (measurements PT);Wheelchair cushion (measurements PT);Hospital bed    Recommendations for Other Services       Precautions / Restrictions Precautions Precautions: Fall Precaution Comments: NWB LE's and immobilizers at all times Required Braces or Orthoses: Knee Immobilizer - Right;Knee Immobilizer - Left Knee Immobilizer - Right: On at all times Knee Immobilizer - Left: On at all times Restrictions Weight Bearing Restrictions: Yes RLE Weight Bearing: Non weight bearing LLE Weight Bearing: Non weight bearing     Mobility  Bed Mobility Overal bed mobility: Needs Assistance Bed Mobility: Supine  to Sit     Supine to sit: Supervision     General bed mobility comments: supervision for safety with braces    Transfers Overall transfer level: Needs assistance Equipment used: 1 person hand held assist Transfers: Bed to chair/wheelchair/BSC            Lateral/Scoot Transfers: Min assist General transfer comment: min of one with chair blocked and locked    Ambulation/Gait               General Gait Details: NWB   Stairs             Wheelchair Mobility    Modified Rankin (Stroke Patients Only)       Balance Overall balance assessment: Needs assistance Sitting-balance support: Single extremity supported Sitting balance-Leahy Scale: Good                                      Cognition Arousal/Alertness: Awake/alert Behavior During Therapy: WFL for tasks assessed/performed Overall Cognitive Status: Within Functional Limits for tasks assessed                                          Exercises      General Comments General comments (skin integrity, edema, etc.): Pt is motivated to work on transfers, found a drop arm chair and locked in place to do lateral scoot with help only to manage braces      Pertinent Vitals/Pain Pain Assessment: Faces Faces Pain Scale: Hurts  a little bit Pain Location: B knees with movement to transfer Pain Descriptors / Indicators: Guarding Pain Intervention(s): Monitored during session;Repositioned    Home Living                          Prior Function            PT Goals (current goals can now be found in the care plan section) Acute Rehab PT Goals Patient Stated Goal: to get independent to move to and from bed Progress towards PT goals: Progressing toward goals    Frequency    Min 5X/week      PT Plan Current plan remains appropriate    Co-evaluation              AM-PAC PT "6 Clicks" Mobility   Outcome Measure  Help needed turning from your back  to your side while in a flat bed without using bedrails?: A Little Help needed moving from lying on your back to sitting on the side of a flat bed without using bedrails?: A Little Help needed moving to and from a bed to a chair (including a wheelchair)?: A Little Help needed standing up from a chair using your arms (e.g., wheelchair or bedside chair)?: Total Help needed to walk in hospital room?: Total Help needed climbing 3-5 steps with a railing? : Total 6 Click Score: 12    End of Session Equipment Utilized During Treatment: Gait belt Activity Tolerance: Patient limited by fatigue;Patient limited by pain Patient left: in chair;with call bell/phone within reach Nurse Communication: Mobility status PT Visit Diagnosis: Muscle weakness (generalized) (M62.81);Pain Pain - Right/Left:  (bilateral) Pain - part of body: Knee     Time: 3762-8315 PT Time Calculation (min) (ACUTE ONLY): 28 min  Charges:  $Therapeutic Activity: 23-37 mins                Ivar Drape 05/03/2021, 4:48 PM  Samul Dada, PT PhD Acute Rehab Dept. Number: [facility] [id number] and [facility] [phone]

## 2021-05-03 NOTE — Progress Notes (Signed)
PROGRESS NOTE  Jay Rosario SWN:[id number] DOB: Sep 15, 1965 DOA: 04/28/2021 PCP: Jay Nian, MD  Brief History   Jay Rosario is a 55 y.o. male with medical history significant of Myasthenia gravis on chronic steroids, HLD presenting to ED with mechanical fall and subsequent bilateral knee pain. He was at a friends house and walking down some steps looking at his phone. He thought he was done with the steps and apparently fell stepping down. He doesn't remember how he fell, but states it was the worse pain of his life. Pain was 10/10 and felt like someone was tearing his legs apart. No radiation. No loss of sensation down the legs. He was immediately unable to bear weight and his wife called 911. He can move the legs side to side, but that is all. He continues to have excruciating pain.      No fever/chills, headaches/vision changes, chest pain or palpitations, no shortness of breath. He has had a mild cough a week ago, but is better and is dry in nature, no stomach pain/N/V/D, no leg swelling or dysuria.    He has a history of MG that he is not followed by neurology any longer. Has history of flairs in the past, but states it only affects his eyes. He denies any visual changes, double vision, blurry vision or ptosis.    ED Course: vitals: afebrile later on tmax of 100.7, bp: 137/85, HR: 73, RR: 20, oxygen 98% RA Pertinent labs: lactic acid 2.1>1.5, CK: 645, UA: 30 protein,  MRI left knee: high grade complete or near complete rupture of the distal quadriceps tendon. Intrasubstance degeneration of the medial meniscus without discrete tear.  MRI right knee: high grade, near complete tear of the quadriceps tendon at the patellar insertion. Small joint effusion and extensive adjacent soft tissue swelling. Nondisplaced horizontal tear of the anterior horn and body of the lateral meniscus with adjacent parameniscal cyst formation. In ED: given pain medication, ortho was consulted and TRH was  asked to admit.   CK has improved to 600. Will change IV lfuids back to NS from sodium bicarbonate. Monitor electrolytes, creatinine, and CK.  Likely to dc tomorrow.   Consultants  Orthopedic surgery  Procedures  Right quadriceps tendon repair Left quadriceps tendon repair  Antibiotics   Anti-infectives (From admission, onward)    Start     Dose/Rate Route Frequency Ordered Stop   04/30/21 1630  ceFAZolin (ANCEF) IVPB 2g/100 mL premix        2 g 200 mL/hr over 30 Minutes Intravenous Every 6 hours 04/30/21 1510 05/01/21 0454   04/30/21 0745  ceFAZolin (ANCEF) IVPB 2g/100 mL premix        2 g 200 mL/hr over 30 Minutes Intravenous On call to O.R. 04/30/21 0736 04/30/21 0930   04/30/21 0737  ceFAZolin (ANCEF) 2-4 GM/100ML-% IVPB       Note to Pharmacy: Cyndia Diver R: cabinet override      04/30/21 0737 04/30/21 1944      Subjective  The patient is resting. He continues to have pain.   Objective   Vitals:  Vitals:   05/03/21 0745 05/03/21 1621  BP: 137/80 137/87  Pulse: 93 84  Resp: 18 18  Temp: 98.4 F (36.9 C) (!) 97.5 F (36.4 C)  SpO2: 92% 96%    Exam:  Constitutional:  The patient is awake, alert, and oriented x 3. Mild distress from pain. Respiratory:  No increased work of breathing. No wheezes, rales, or rhonchi  No tactile fremitus Cardiovascular:  Regular rate and rhythm No murmurs, ectopy, or gallups. No lateral PMI. No thrills. Abdomen:  Abdomen is soft, non-tender, non-distended No hernias, masses, or organomegaly Normoactive bowel sounds.  Musculoskeletal:  No cyanosis, clubbing, or edema Skin:  No rashes, lesions, ulcers palpation of skin: no induration or nodules Psychiatric:  Mental status Mood, affect appropriate Orientation to person, place, time  judgment and insight appear intact   I have personally reviewed the following:   Today's Data  Vitals  Lab Data  CBC CMP  Micro Data    Imaging  X-ray Chest X-ray Femur   X-ray pelvis MRI Right knee MRI Left Knee  Cardiology Data  EKG  Scheduled Meds:  apixaban  2.5 mg Oral BID   docusate sodium  100 mg Oral BID   predniSONE  10 mg Oral Q breakfast   Continuous Infusions:  sodium chloride 100 mL/hr at 05/03/21 1202    Principal Problem:   Quadriceps muscle rupture Active Problems:   Myasthenia gravis (HCC)   Hyperlipidemia with target LDL less than 130   Erectile dysfunction   Elevated CK   Prolonged QT interval   LOS: 4 days   A & P  Principal Problem:  bilateral Quadriceps muscle rupture 55 year old male presenting to ED after mechanical fall with subsequent bilateral knee pain and inability to bear weight found to have bilateral ruptured quadriceps -admit to medical telemetry -ortho consulted, likely OR tomorrow. Knee immobilizers bilaterally  -pain control with norco and dilaudid -IVF fluids changed from NS to sodium bicarbonate. -plan per ortho   Active Problems:   Elevated CK Acquired due to trauma from mechanical fall  CK to 656 secondary to above on admission. Down to 600 today with IV sodium bicarbonate. Creatinine is down to 0.86 from high of 0.95 from 0.85 at baseline. IV sodium bicarbonate has been discontinued. He will be ocntinued on IV NS.Monitor CK and creatinine. Repeat CK and BMP in AM.     Prolonged QT interval- Resolved. New since ekg in 2019, possibly secondary to elevated CK Check magnesium, other electrolytes wnl Telemetry x 24 hours Repeat ekg in AM Avoid qt prolonging drugs     Myasthenia gravis (HCC) No signs of exacerbation at this time. The patient states that his symptoms are primarily ocular. He denies any signs of a flare. Continue daily prednisone at 10mg /daily     Erectile dysfunction Hold cialis while inpatient      Hyperlipidemia with target LDL less than 130 Diet controlled   There is no height or weight on file to calculate BMI.  I have seen and examined this patient myself I have  spent in his evaluation and care.   Level of care: Telemetry Medical DVT prophylaxis:  TED hose  Code Status:  Full - confirmed with patient/family Family Communication: wife at bedside:  Disposition Plan:  The patient is from: home             Anticipated d/c is to: home               Requires inpatient hospitalization and is at significant risk of worsening, requires constant monitoring, assessment/intervention, IV pain control and MDM with specialists.     Darolyn Double, DO [facility] Direct contact: see [email or web address]  7PM-7AM contact night coverage as above 05/02/2021, 7:02 PM  LOS: 1 day

## 2021-05-03 NOTE — TOC Progression Note (Addendum)
Transition of Care [facility]) - Progression Note    Patient Details  Name: Jay Rosario MRN: [id number] Date of Birth: 1965/10/07  [facility] CM/SW Contact  Daymein Nunnery, Adria Devon, RN Phone Number: 05/03/2021, 10:59 AM  Clinical Narrative:     Darnelle Maffucci with [facility] can accept referral for HHPT. WIll need orders. [employer] has called [employer] [employer] regarding delivery of DME. [name] with [employer] will call [employer] back today at noon. [employer] will need to deliver DME on day of discharge. Insurance will not cover DME at home if patient still inpatient at hospital.   Patient did receive IV pain medication this morning.   NCM secure chatted MD to see if discharge date is known. NCM will update [employer], patient and [employer] once response is received.    1338 Discharge will probably be tomorrow. DME will need to be delivered prior to patient going home by ambulance. Velna Hatchet with [employer] aware, patient, and wife aware. Left hand off for TOC . Stacie with [facility] aware   1620 [employer] [employer] called requesting a CNA. NCM explained will see if [facility] will approve an aide if they do likley only 1 to 2 times a week for a bath. NCM spoke with Stacie with [facility] and gave verbal order for aide. Misty Stanley will see if [facility] will approve , if they do only be 1 visit a week for bath. They will follow up with wife. NCM updated [employer] who voiced understanding   Expected Discharge Plan and Services     Discharge Planning Services: CM Consult   Living arrangements for the past 2 months: Single Family Home                                       Social Determinants of Health (SDOH) Interventions    Readmission Risk Interventions No flowsheet data found.

## 2021-05-04 DIAGNOSIS — S76119D Strain of unspecified quadriceps muscle, fascia and tendon, subsequent encounter: Secondary | ICD-10-CM

## 2021-05-04 LAB — BASIC METABOLIC PANEL
Anion gap: 8 (ref 5–15)
BUN: 14 mg/dL (ref 6–20)
CO2: 26 mmol/L (ref 22–32)
Calcium: 8.5 mg/dL — ABNORMAL LOW (ref 8.9–10.3)
Chloride: 101 mmol/L (ref 98–111)
Creatinine, Ser: 0.9 mg/dL (ref 0.61–1.24)
GFR, Estimated: >60 mL/min (ref >60)
Glucose, Bld: 148 mg/dL — ABNORMAL HIGH (ref 70–99)
Potassium: 3.8 mmol/L (ref 3.5–5.1)
Sodium: 135 mmol/L (ref 135–145)

## 2021-05-04 LAB — CBC WITH DIFFERENTIAL/PLATELET
Abs Immature Granulocytes: 0.04 10*3/uL (ref 0.00–0.07)
Basophils Absolute: 0 10*3/uL (ref 0.0–0.1)
Basophils Relative: 1 %
Eosinophils Absolute: 0.3 10*3/uL (ref 0.0–0.5)
Eosinophils Relative: 4 %
HCT: 41.3 % (ref 39.0–52.0)
Hemoglobin: 13.9 g/dL (ref 13.0–17.0)
Immature Granulocytes: 1 %
Lymphocytes Relative: 21 %
Lymphs Abs: 1.6 10*3/uL (ref 0.7–4.0)
MCH: 31.7 pg (ref 26.0–34.0)
MCHC: 33.7 g/dL (ref 30.0–36.0)
MCV: 94.3 fL (ref 80.0–100.0)
Monocytes Absolute: 1.1 10*3/uL — ABNORMAL HIGH (ref 0.1–1.0)
Monocytes Relative: 15 %
Neutro Abs: 4.5 10*3/uL (ref 1.7–7.7)
Neutrophils Relative %: 58 %
Platelets: 224 10*3/uL (ref 150–400)
RBC: 4.38 MIL/uL (ref 4.22–5.81)
RDW: 12.8 % (ref 11.5–15.5)
WBC: 7.5 10*3/uL (ref 4.0–10.5)
nRBC: 0 % (ref 0.0–0.2)

## 2021-05-04 LAB — CK: Total CK: 369 U/L (ref 49–397)

## 2021-05-04 MED ORDER — OXYCODONE HCL 10 MG PO TABS: 10.0000 mg | ORAL_TABLET | ORAL | 0 refills | Status: DC | PRN

## 2021-05-04 MED ORDER — OXYCODONE HCL 10 MG PO TABS
10.0000 mg | ORAL_TABLET | ORAL | 0 refills | Status: DC | PRN
  Filled 2021-05-04: qty 30, 4d supply, fill #0

## 2021-05-04 MED ORDER — APIXABAN 2.5 MG PO TABS: 2.5000 mg | ORAL_TABLET | Freq: Two times a day (BID) | ORAL | 0 refills | Status: DC

## 2021-05-04 MED ORDER — APIXABAN 2.5 MG PO TABS
2.5000 mg | ORAL_TABLET | Freq: Two times a day (BID) | ORAL | 0 refills | Status: DC
  Filled 2021-05-04: qty 78, 39d supply, fill #0

## 2021-05-04 NOTE — Progress Notes (Signed)
Orthopedic Tech Progress Note Patient Details:  Jay Rosario [birth year] [id number]  RN Zella Ball called regarding knee immobilizers irritating pt and that Verdon Cummins, PA-C mentioned possibly adding some padding to mitigate the discomfort from the brace. Came up to pt's room at 1630 to see what could be adjusted. Upon removal of the knee immobilizers I noticed both ace wraps were bunched/rolled up around pt's thighs and as a result causing some mild swelling due to being loose at the feet and much tighter at the thigh. I took down the ace wraps, leaving the bandage over the incisions undisturbed. This allowed immediate relief to the patient. Rewrapped both legs with the ace being certain to gradually loosen the compression to better facilitate a decrease in swelling. Cotton webril was added at the top of his thigh to give additional reinforcement against irritation and reapplied/adjusted the knee immobilizers. This helped pt immensely.   Encouraged ice and elevation of BLE and that most questions regarding care once home could be found in his discharge instruction packet from Dr. Sherilyn Dacosta, but could also call the number provided in that packet should something arise that is urgent in nature or not in the instructions.  Ortho Devices Type of Ortho Device: Knee Immobilizer, Ace wrap Ortho Device/Splint Location: BLE Ortho Device/Splint Interventions: Adjustment   Post Interventions Patient Tolerated: Well Instructions Provided: Care of device  [name] [name] 05/04/2021, 6:17 PM

## 2021-05-04 NOTE — Progress Notes (Signed)
Physical Therapy Treatment Patient Details Name: Jay Rosario MRN: [id number] DOB: 1966/01/12 Today's Date: 05/04/2021   History of Present Illness 55 yo male presenting to ED on 12/11 with mechnical fall and bilateral knee pain. MRI showing bilateral quadriceps tendon rupture. S/p bilateral quadriceps tendon repair on 12/13. PMH including myasthenia gravis and HLD.    PT Comments    Pt continues to require min A only for lateral scoot transfers to chair.  Has questions re: transferring to w/c; unfortunately, w/c for home is not present to practice with.  Discussed options between lateral scoot and AP transfer, both of which will require inc assist of LE's to maintain straight legs during transition.  Safe to return home with assist from spouse, equipment indicated, and HH PT to practice transfers to home equipment once available.  Recommendations for follow up therapy are one component of a multi-disciplinary discharge planning process, led by the attending physician.  Recommendations may be updated based on patient status, additional functional criteria and insurance authorization.  Follow Up Recommendations  Home health PT     Assistance Recommended at Discharge Intermittent Supervision/Assistance  Equipment Recommendations  BSC/3in1;Wheelchair (measurements PT);Hospital bed    Recommendations for Other Services       Precautions / Restrictions Precautions Precautions: Fall Required Braces or Orthoses: Knee Immobilizer - Right;Knee Immobilizer - Left Knee Immobilizer - Right: On at all times Knee Immobilizer - Left: On at all times Restrictions Weight Bearing Restrictions: Yes RLE Weight Bearing: Non weight bearing LLE Weight Bearing: Non weight bearing     Mobility  Bed Mobility                 Patient Response: Cooperative  Transfers Overall transfer level: Needs assistance                Lateral/Scoot Transfers: Min assist General transfer  comment: Pt perfroms lateral scoot from bed > chair with min A for B LE negotiation.  Spouse and pt. have questions about use of BSC/w/c at home.  Discussed lateral transfer vs AP transfer depending on activity.    Ambulation/Gait                   Stairs             Wheelchair Mobility    Modified Rankin (Stroke Patients Only)       Balance                                            Cognition Arousal/Alertness: Awake/alert Behavior During Therapy: WFL for tasks assessed/performed Overall Cognitive Status: Within Functional Limits for tasks assessed                                 General Comments: Pt. is sitting up in bed working on bed bath.  Spouse is present in room.  Agreeable to work with PT.        Exercises Other Exercises Other Exercises: Discussed doing ABD/ADD and ankle pumps only at this time.    General Comments        Pertinent Vitals/Pain Pain Assessment: 0-10 Pain Score: 2  Pain Location: B LEs Pain Descriptors / Indicators: Tender;Discomfort Pain Intervention(s): Repositioned    Home Living  Prior Function            PT Goals (current goals can now be found in the care plan section) Progress towards PT goals: Progressing toward goals    Frequency    Min 5X/week      PT Plan Current plan remains appropriate    Co-evaluation              AM-PAC PT "6 Clicks" Mobility   Outcome Measure  Help needed turning from your back to your side while in a flat bed without using bedrails?: None Help needed moving from lying on your back to sitting on the side of a flat bed without using bedrails?: A Little Help needed moving to and from a bed to a chair (including a wheelchair)?: A Little Help needed standing up from a chair using your arms (e.g., wheelchair or bedside chair)?: Total Help needed to walk in hospital room?: Total Help needed climbing 3-5  steps with a railing? : Total 6 Click Score: 13    End of Session   Activity Tolerance: Patient tolerated treatment well Patient left: in chair;with call bell/phone within reach;with family/visitor present         Time: 2952-8413 PT Time Calculation (min) (ACUTE ONLY): 18 min  Charges:  $Therapeutic Activity: 8-22 mins                     Jay Rosario A. Damaris Geers, PT, DPT Acute Rehabilitation Services Office: [phone]    Jay Rosario A Jay Rosario 05/04/2021, 10:56 AM

## 2021-05-04 NOTE — TOC Transition Note (Addendum)
Transition of Care [facility]) - CM/SW Discharge Note   Patient Details  Name: Jay Rosario MRN: [id number] Date of Birth: 1966-05-14  Transition of Care [facility]) CM/SW Contact:  Bess Kinds, RN Phone Number: [phone] 05/04/2021, 12:02 PM   Clinical Narrative:     Notified by nursing of DC order. Spoke with Jasmine at [facility] - [name] to call spouse to arrange for delivery of DME. Spoke with Laurelyn Sickle at [facility] - patient approved for PT and HHA. Requested MD update HH orders. Will arrange transport when DME delivered.   UPDATE: Spoke with patient and spouse at bedside to discuss post acute transition home. DME delivery is in progress. Advised that [facility] accepted referral for PT/HHA. Home address verified - [facility] arranged. Discharge medications rerouted to [facility] per patient/wife request. No further TOC needs identified at this time.   Final next level of care: Home w Home Health Services Barriers to Discharge: No Barriers Identified   Patient Goals and CMS Choice Patient states their goals for this hospitalization and ongoing recovery are:: to go home CMS Medicare.gov Compare Post Acute Care list provided to:: Patient    Discharge Placement                       Discharge Plan and Services   Discharge Planning Services: CM Consult                Date DME Agency Contacted: 05/04/21 Time DME Agency Contacted: 1201 Representative spoke with at DME Agency: Leavy Cella HH Arranged: PT, Nurse's Aide HH Agency: [facility] Date [facility] Agency Contacted: 05/04/21 Time HH Agency Contacted: 1201 Representative spoke with at [facility] Agency: Laurelyn Sickle  Social Determinants of Health (SDOH) Interventions     Readmission Risk Interventions No flowsheet data found.

## 2021-05-04 NOTE — Progress Notes (Signed)
Subjective: Patient appears comfortable in bed.  Denies nausea, vomiting, or fevers.  Said his pain is well controlled.  He is very much looking forward to discharge. He has been working with physical therapy.He does have some questions about his knee immobilizers and being discharged in a wheelchair. Family at bedside.  Objective: Vital signs in last 24 hours: Temp:  [97.5 F (36.4 C)-99 F (37.2 C)] 97.8 F (36.6 C) (12/17 0752) Pulse Rate:  [69-84] 75 (12/17 0752) Resp:  [18-19] 19 (12/17 0752) BP: (122-137)/(73-87) 123/73 (12/17 0752) SpO2:  [95 %-100 %] 100 % (12/17 0752)  Intake/Output from previous day: 12/16 0701 - 12/17 0700 In: 1980 [P.O.:480; I.V.:1500] Out: 1700 [Urine:1700] Intake/Output this shift: Total I/O In: 240 [P.O.:240] Out: 300 [Urine:300]  Recent Labs    05/02/21 0253 05/04/21 0205  HGB 15.1 13.9   Recent Labs    05/02/21 0253 05/04/21 0205  WBC 11.7* 7.5  RBC 4.62 4.38  HCT 43.2 41.3  PLT 189 224   Recent Labs    05/03/21 0957 05/04/21 0205  NA 130* 135  K 3.5 3.8  CL 97* 101  CO2 26 26  BUN 12 14  CREATININE 0.86 0.90  GLUCOSE 170* 148*  CALCIUM 8.3* 8.5*   No results for input(s): LABPT, INR in the last 72 hours.  Physical exam: Examination of the bilateral lower extremities demonstrates intact knee immobilizers and Ace wraps bilaterally.No drainage appreciated.  Active range of motion of the ankles intact.  He is able to wiggle his toes without difficulty.  Exposed skin is intact. Neurovascularly intact to bilateral lower extremities distally.  Assessment/Plan:  QUAD TENDON REPAIR (Bilateral)   Plan Mobility: Will require wheelchair for 2 weeks while nonweightbearing on bilateral lower extremities. Please provide accommodating padded leg rests bilaterally on wheelchair. They should be placed to allow the bilateral knees to remain in extension. Pain control: Continue to wean/titrate to appropriate oral regimen DVT Prophylaxis:  Eliquis 2.5 mg p.o. twice daily x6 weeks Further surgical plans: None RUE: No restrictions LUE: No restrictions RLE: Nonweightbearing, knee immobilizer at all times except hygiene LLE: Nonweightbearing, knee immobilizer at all times except hygiene Advised he may use moleskin and padding in areas of friction about his knee immobilizers.  Disposition: Appropriate for discharge from orthopedic standpoint Dressing care: Keep Mepilex on and dry for 5 days.  Do not allow surgical area to get wet before that.  Remove Mepilex dressing after 5 days and allow area to get wet in shower but DO NOT SUBMERGE. Follow-up: Please call [facility] [phone]) to schedule follow-op appointment for 2 weeks after surgery.    Jerilyn Gillaspie J [place] 05/04/2021, 1:25 PM

## 2021-05-05 NOTE — Discharge Summary (Addendum)
Physician Discharge Summary  Jay Rosario UOR:[id number] DOB: 06-02-1965 DOA: 04/28/2021  PCP: Jay Nian, MD  Admit date: 04/28/2021 Discharge date: 05/05/2021  Recommendations for Outpatient Follow-up:  The patient was discharged to home with Home health PT and home aide. He was discharged with a hospital bed, 3-1, and wheelchair. He was to follow up with orthopedic surgery as directed in 2 weeks He was to remove mepilex after another 3 days. Until then he is not to shower. After that he may shower, but not submerge incision sites in water.    Follow-up Information     Ernestina Columbia, MD. Schedule an appointment as soon as possible for a visit in 2 week(s).   Specialty: Orthopedic Surgery Contact information: [address] [place] [address] [phone]         [facility] Follow up.   Why: [facility], agency that equipment was ordered through Contact information: [address] [place] [address] [phone]                  Discharge Diagnoses: Principal diagnosis is #1 Bilateral rupture of quadriceps with surgical repair Rhabdomyolysis Prolonged QT Hypomagnesemia   Discharge Condition: Fair  Disposition: Home with home health  Diet recommendation: Heart healthy. Drink plenty of water.  Filed Weights   04/30/21 0740  Weight: 111.1 kg    History of present illness:  Jay Rosario is a 55 y.o. male with medical history significant of Myasthenia gravis on chronic steroids, HLD presenting to ED with mechanical fall and subsequent bilateral knee pain. He was at a friends house and walking down some steps looking at his phone. He thought he was done with the steps and apparently fell stepping down. He doesn't remember how he fell, but states it was the worse pain of his life. Pain was 10/10 and felt like someone was tearing his legs apart. No radiation. No loss of sensation down the legs. He was immediately unable  to bear weight and his wife called 911. He can move the legs side to side, but that is all. He continues to have excruciating pain.      No fever/chills, headaches/vision changes, chest pain or palpitations, no shortness of breath. He has had a mild cough a week ago, but is better and is dry in nature, no stomach pain/N/V/D, no leg swelling or dysuria.    He has a history of MG that he is not followed by neurology any longer. Has history of flairs in the past, but states it only affects his eyes. He denies any visual changes, double vision, blurry vision or ptosis.    ED Course: vitals: afebrile later on tmax of 100.7, bp: 137/85, HR: 73, RR: 20, oxygen 98% RA Pertinent labs: lactic acid 2.1>1.5, CK: 645, UA: 30 protein,  MRI left knee: high grade complete or near complete rupture of the distal quadriceps tendon. Intrasubstance degeneration of the medial meniscus without discrete tear.  MRI right knee: high grade, near complete tear of the quadriceps tendon at the patellar insertion. Small joint effusion and extensive adjacent soft tissue swelling. Nondisplaced horizontal tear of the anterior horn and body of the lateral meniscus with adjacent parameniscal cyst formation. In ED: given pain medication, ortho was consulted and [facility] was asked to admit.   Hospital Course:  The patient was admitted to a med-surg bed. He underwent operative repair of the ruptured quadriceps on 04/28/2021. He tolerated the procedure well. He had mild rhabdomyolysis  when he was admitted. He was given IV fluids. However postoperatively his CK increased to greater than 1800. He was started on sodium bicarbonate drip. There was a minimal increase in creatinine that went with this. IV fluids were changed to NS when CK droipped below 1000. On the date of discharge the patient's ck was 600. Also on the day of discharge ortho tech visited the patient to address discomfort inside the knee immobilizer. They were able to make the patient  comfortably by adjusting his ace wraps and by padding the immobilizer.  The patient also had QTc prolongation on admission. This was resolved after IV fluids resuscitation.   Throughout his stay the patient was continued on his usual dose of prednisone for myasthenia gravis. He denied any signs of flare.  Today's assessment: S: The patient is resting comfortably. No new complaints. O: Vitals:  Vitals:   05/04/21 0752 05/04/21 1554  BP: 123/73 113/74  Pulse: 75 69  Resp: 19 19  Temp: 97.8 F (36.6 C) 98.3 F (36.8 C)  SpO2: 100% 98%    Exam:  Constitutional:  The patient is awake, alert, and oriented x 3. No acute distress. Respiratory:  No increased work of breathing. No wheezes, rales, or rhonchi No tactile fremitus Cardiovascular:  Regular rate and rhythm No murmurs, ectopy, or gallups. No lateral PMI. No thrills. Abdomen:  Abdomen is soft, non-tender, non-distended No hernias, masses, or organomegaly Normoactive bowel sounds.  Musculoskeletal:  No cyanosis, clubbing, or edema Skin:  No rashes, lesions, ulcers palpation of skin: no induration or nodules Neurologic:  CN 2-12 intact Sensation all 4 extremities intact Psychiatric:  Mental status Mood, affect appropriate Orientation to person, place, time  judgment and insight appear intact   Discharge Instructions  Discharge Instructions     Activity as tolerated - No restrictions   Complete by: As directed    Call MD for:  redness, tenderness, or signs of infection (pain, swelling, redness, odor or green/yellow discharge around incision site)   Complete by: As directed    Call MD for:  severe uncontrolled pain   Complete by: As directed    Call MD for:  temperature >100.4   Complete by: As directed    Diet - low sodium heart healthy   Complete by: As directed    Discharge wound care:   Complete by: As directed    May remove Mepilex dressing on 05/07/2021. Keep dry until then with no showers. After  that may shower, but do not submerge the wounds in water.   For home use only DME Hospital bed   Complete by: As directed    Length of Need: Lifetime   Patient has (list medical condition): myasthenia gravis, bilateral quadriceps rupture   Bed type: Semi-electric   Trapeze Bar: Yes   Increase activity slowly   Complete by: As directed       Allergies as of 05/04/2021   No Known Allergies      Medication List     STOP taking these medications    diphenhydramine-acetaminophen 25-500 MG Tabs tablet Commonly known as: TYLENOL PM       TAKE these medications    apixaban 2.5 MG Tabs tablet Commonly known as: ELIQUIS Take 1 tablet (2.5 mg total) by mouth 2 (two) times daily.   Oxycodone HCl 10 MG Tabs Take 1-1.5 tablets (10-15 mg total) by mouth every 4 (four) hours as needed (pain score 7-10).   predniSONE 10 MG tablet Commonly known as: DELTASONE  TAKE 1 TABLET BY MOUTH TWICE A DAY What changed:  how much to take how to take this when to take this additional instructions   tadalafil 20 MG tablet Commonly known as: CIALIS TAKE ONE TABLET BY MOUTH DAILY AS NEEDED FOR ERECTILE DYSFUNCTION What changed: See the new instructions.               Durable Medical Equipment  (From admission, onward)           Start     Ordered   05/04/21 0000  For home use only DME Hospital bed       Question Answer Comment  Length of Need Lifetime   Patient has (list medical condition): myasthenia gravis, bilateral quadriceps rupture   Bed type Semi-electric   Trapeze Bar Yes      05/04/21 1112              Discharge Care Instructions  (From admission, onward)           Start     Ordered   05/04/21 0000  Discharge wound care:       Comments: May remove Mepilex dressing on 05/07/2021. Keep dry until then with no showers. After that may shower, but do not submerge the wounds in water.   05/04/21 1112           No Known Allergies  The results of  significant diagnostics from this hospitalization (including imaging, microbiology, ancillary and laboratory) are listed below for reference.    Significant Diagnostic Studies: DG Pelvis Portable  Result Date: 04/28/2021 CLINICAL DATA:  Larey Seat downstairs, bilateral thigh pain EXAM: PORTABLE PELVIS 1-2 VIEWS COMPARISON:  None. FINDINGS: Single frontal view of the pelvis was performed. Cassette artifact limits evaluation of the upper pelvis. No acute fractures. Alignment is anatomic. Joint spaces are well preserved. IMPRESSION: 1. No acute bony abnormality. Electronically Signed   By: Sharlet Salina M.D.   On: 04/28/2021 20:02   MR KNEE RIGHT WO CONTRAST  Result Date: 04/29/2021 CLINICAL DATA:  suspect quadriceps tendon rupture EXAM: MRI OF THE RIGHT KNEE WITHOUT CONTRAST TECHNIQUE: Multiplanar, multisequence MR imaging of the knee was performed. No intravenous contrast was administered. COMPARISON:  None. FINDINGS: MENISCI Medial: Intrasubstance degenerative signal without definitive meniscus tear. Adjacent parameniscal cyst formation measuring 2.5 x 1.4 x 3.5 cm (coronal T2 image 14, axial T2 image 13). Lateral: Nondisplaced horizontal tear of the anterior horn and body of the lateral meniscus. LIGAMENTS Cruciates: ACL and PCL are intact. Collaterals: Medial collateral ligament is intact. Lateral collateral ligament complex is intact. CARTILAGE Patellofemoral:  Mild chondrosis. Medial:  Mild chondrosis. Lateral:  Mild chondrosis. JOINT: Small joint effusion. POPLITEAL FOSSA: Miniscule Baker cyst. EXTENSOR MECHANISM: There is a high-grade, near complete tear of the quadriceps tendon at the patellar insertion. The patellar tendon is intact. The retinacula are intact. BONES: No acute fracture or dislocation. No aggressive osseous lesion. Tricompartment osteophyte formation. Extensive soft tissue swelling along the suprapatellar knee. Other: No fluid collection or hematoma. Muscles are normal. IMPRESSION:  High-grade, near complete tear of the quadriceps tendon at the patellar insertion. Small joint effusion and extensive adjacent soft tissue swelling. Nondisplaced horizontal tear of the anterior horn and body of the lateral meniscus with adjacent parameniscal cyst formation. Mild tricompartment osteoarthritis. Electronically Signed   By: Caprice Renshaw M.D.   On: 04/29/2021 10:01   MR KNEE LEFT WO CONTRAST  Result Date: 04/29/2021 CLINICAL DATA:  Fall, thigh pain. Suspected quadriceps tendon rupture EXAM: MRI  OF THE LEFT KNEE WITHOUT CONTRAST TECHNIQUE: Multiplanar, multisequence MR imaging of the knee was performed. No intravenous contrast was administered. COMPARISON:  AP left femur radiograph 04/28/2021 FINDINGS: MENISCI Medial meniscus:  Intrasubstance degeneration without discrete tear. Lateral meniscus:  Intact. LIGAMENTS Cruciates: Intact ACL with tiny intrasubstance ganglion cyst. Intact PCL. Collaterals: Medial collateral ligament is intact. Lateral collateral ligament complex is intact. CARTILAGE Patellofemoral: Mild chondral thinning and surface irregularity along the lateral patellar facet. Medial: Mild chondral surface irregularity along the lateral margin of the weight-bearing medial femoral condyle. Lateral:  No chondral defect. Joint: Small joint effusion. Edematous appearance of the suprapatellar fat pad. Hoffa's fat is normal in appearance. Popliteal Fossa:  No Baker cyst. Intact popliteus tendon. Extensor Mechanism: High-grade complete or near complete rupture of the distal quadriceps tendon, particularly involving the central and medial aspects of the distal tendon at its patellar attachment. Intact patellar tendon. Bones: No acute fracture. No dislocation. Mild tricompartmental joint space narrowing. Other: Soft tissue edema and ill-defined fluid at the anterior aspect of the knee surrounding the quadriceps tendon rupture site. IMPRESSION: Left knee: 1. High-grade complete or near-complete  rupture of the distal quadriceps tendon. 2. Small joint effusion. 3. Intrasubstance degeneration of the medial meniscus without discrete tear. 4. Intact ACL with tiny intrasubstance ganglion cyst. Electronically Signed   By: Duanne Guess D.O.   On: 04/29/2021 10:10   DG Chest Port 1 View  Result Date: 04/28/2021 CLINICAL DATA:  Larey Seat, found down EXAM: PORTABLE CHEST 1 VIEW COMPARISON:  05/10/2018 FINDINGS: The heart size and mediastinal contours are within normal limits. Both lungs are clear. The visualized skeletal structures are unremarkable. IMPRESSION: No active disease. Electronically Signed   By: Sharlet Salina M.D.   On: 04/28/2021 19:58   DG FEMUR PORT 1V LEFT  Result Date: 04/28/2021 CLINICAL DATA:  Larey Seat down stairs, bilateral 5 pain EXAM: RIGHT FEMUR PORTABLE 1 VIEW; LEFT FEMUR PORTABLE 1 VIEW COMPARISON:  None. FINDINGS: Right femur: 2 frontal views of the right femur are obtained. There are no acute fractures. Alignment appears grossly anatomic on this single projection. Soft tissues are unremarkable. Left femur: 2 frontal views of the left femur are obtained. There are no acute fractures. Alignment appears grossly anatomic on the single projection. Soft tissues are unremarkable. IMPRESSION: 1. Unremarkable bilateral femurs. Electronically Signed   By: Sharlet Salina M.D.   On: 04/28/2021 20:02   DG FEMUR PORT, 1V RIGHT  Result Date: 04/28/2021 CLINICAL DATA:  Larey Seat down stairs, bilateral 5 pain EXAM: RIGHT FEMUR PORTABLE 1 VIEW; LEFT FEMUR PORTABLE 1 VIEW COMPARISON:  None. FINDINGS: Right femur: 2 frontal views of the right femur are obtained. There are no acute fractures. Alignment appears grossly anatomic on this single projection. Soft tissues are unremarkable. Left femur: 2 frontal views of the left femur are obtained. There are no acute fractures. Alignment appears grossly anatomic on the single projection. Soft tissues are unremarkable. IMPRESSION: 1. Unremarkable bilateral  femurs. Electronically Signed   By: Sharlet Salina M.D.   On: 04/28/2021 20:02    Microbiology: Recent Results (from the past 240 hour(s))  Resp Panel by RT-PCR (Flu A&B, Covid) Nasopharyngeal Swab     Status: None   Collection Time: 04/28/21  9:04 PM   Specimen: Nasopharyngeal Swab; Nasopharyngeal(NP) swabs in vial transport medium  Result Value Ref Range Status   SARS Coronavirus 2 by RT PCR NEGATIVE NEGATIVE Final    Comment: (NOTE) SARS-CoV-2 target nucleic acids are NOT DETECTED.  The SARS-CoV-2  RNA is generally detectable in upper respiratory specimens during the acute phase of infection. The lowest concentration of SARS-CoV-2 viral copies this assay can detect is 138 copies/mL. A negative result does not preclude SARS-Cov-2 infection and should not be used as the sole basis for treatment or other patient management decisions. A negative result may occur with  improper specimen collection/handling, submission of specimen other than nasopharyngeal swab, presence of viral mutation(s) within the areas targeted by this assay, and inadequate number of viral copies(<138 copies/mL). A negative result must be combined with clinical observations, patient history, and epidemiological information. The expected result is Negative.  Fact Sheet for Patients:  BloggerCourse.com  Fact Sheet for Healthcare Providers:  SeriousBroker.it  This test is no t yet approved or cleared by the [place] FDA and  has been authorized for detection and/or diagnosis of SARS-CoV-2 by FDA under an Emergency Use Authorization (EUA). This EUA will remain  in effect (meaning this test can be used) for the duration of the COVID-19 declaration under Section 564(b)(1) of the Act, 21 U.S.C.section 360bbb-3(b)(1), unless the authorization is terminated  or revoked sooner.       Influenza A by PCR NEGATIVE NEGATIVE Final   Influenza B by PCR NEGATIVE  NEGATIVE Final    Comment: (NOTE) The Xpert Xpress SARS-CoV-2/FLU/RSV plus assay is intended as an aid in the diagnosis of influenza from Nasopharyngeal swab specimens and should not be used as a sole basis for treatment. Nasal washings and aspirates are unacceptable for Xpert Xpress SARS-CoV-2/FLU/RSV testing.  Fact Sheet for Patients: BloggerCourse.com  Fact Sheet for Healthcare Providers: SeriousBroker.it  This test is not yet approved or cleared by the [place] FDA and has been authorized for detection and/or diagnosis of SARS-CoV-2 by FDA under an Emergency Use Authorization (EUA). This EUA will remain in effect (meaning this test can be used) for the duration of the COVID-19 declaration under Section 564(b)(1) of the Act, 21 U.S.C. section 360bbb-3(b)(1), unless the authorization is terminated or revoked.  Performed at [facility], [address] [place], [place] [address]      Labs: Basic Metabolic Panel: Recent Labs  Lab 04/29/21 1326 04/30/21 0311 05/01/21 0446 05/02/21 0253 05/02/21 1036 05/03/21 0957 05/04/21 0205  NA  --    < > 130* 133* 133* 130* 135  K  --    < > 3.8 3.5 3.5 3.5 3.8  CL  --    < > 101 99 98 97* 101  CO2  --    < > GLUCOSE  --    < > 134* 148* 166* 170* 148*  BUN  --    < > CREATININE  --    < > 0.87 0.95 0.92 0.86 0.90  CALCIUM  --    < > 8.0* 8.5* 8.7* 8.3* 8.5*  MG 2.0  --   --   --   --   --   --    < > = values in this interval not displayed.   Liver Function Tests: Recent Labs  Lab 04/28/21 2227 04/30/21 0311 05/01/21 0446  AST 34 28 39  ALT 45* 31 28  ALKPHOS 55 46 44  BILITOT 0.4 0.8 0.7  PROT 6.5 6.1* 6.1*  ALBUMIN 3.4* 3.1* 2.8*   No results for input(s): LIPASE, AMYLASE in the last 168 hours. No results for input(s): AMMONIA in the last 168 hours. CBC: Recent Labs  Lab 04/28/21 1850 04/30/21 0311 05/01/21 0446  05/02/21 0253 05/04/21 0205  WBC 9.2 9.1 14.0* 11.7* 7.5  NEUTROABS  --   --  10.3* 8.1* 4.5  HGB 16.2 14.5 14.1 15.1 13.9  HCT 47.0 44.8 40.7 43.2 41.3  MCV 95.5 98.9 94.4 93.5 94.3  PLT 260 188 184 189 224   Cardiac Enzymes: Recent Labs  Lab 04/30/21 0311 05/01/21 0446 05/02/21 1036 05/03/21 0957 05/04/21 0205  CKTOTAL 863* 1,805* 1,469* 606* 369   BNP: BNP (last 3 results) No results for input(s): BNP in the last 8760 hours.  ProBNP (last 3 results) No results for input(s): PROBNP in the last 8760 hours.  CBG: Recent Labs  Lab 05/02/21 0750 05/02/21 1151  GLUCAP 132* 151*    Principal Problem:   Quadriceps muscle rupture Active Problems:   Myasthenia gravis (HCC)   Hyperlipidemia with target LDL less than 130   Erectile dysfunction   Elevated CK   Prolonged QT interval   Time coordinating discharge: 38 minutes.  Signed:        Paitynn Mikus, DO [facility]  05/05/2021, 7:41 AM

## 2021-05-06 ENCOUNTER — Other Ambulatory Visit (HOSPITAL_COMMUNITY): Payer: Self-pay

## 2021-05-14 NOTE — Care Management (Signed)
[name] called. They did not receive bedside commode with drop arm. When [employer] delivered the hospital bed they brought a bedside commode without a drop arm, [name] sent it back.   NCM spoke to [place] with [employer] and a bedside commode with drop arm was delivered 05/13/21.   [name] called this morning asking transportation. NCM provided referral to [employer]. [facility] [employer] will call [name] to arrange transportation.

## 2021-06-03 ENCOUNTER — Telehealth: Payer: Self-pay | Admitting: *Deleted

## 2021-06-03 NOTE — Telephone Encounter (Signed)
Transition Care Management Unsuccessful Follow-up Telephone Call  Date of discharge and from where: 05/05/21  [facility]  Attempts:  1st Attempt  Reason for unsuccessful TCM follow-up call:  No answer/busy  Irving Shows [facility], BSN RN Case Manager [phone]

## 2021-06-05 ENCOUNTER — Telehealth: Payer: Self-pay

## 2021-06-05 NOTE — Telephone Encounter (Signed)
Transition Care Management Unsuccessful Follow-up Telephone Call  Date of discharge and from where:  05/04/2021  [facility]  Attempts:  2nd Attempt  Reason for unsuccessful TCM follow-up call:  No answer/busy  Rowe Pavy, RN, BSN, CEN [facility] Coordinator [phone]

## 2021-06-11 ENCOUNTER — Other Ambulatory Visit: Payer: Self-pay | Admitting: Family Medicine

## 2021-06-11 DIAGNOSIS — G7000 Myasthenia gravis without (acute) exacerbation: Secondary | ICD-10-CM

## 2021-06-12 NOTE — Telephone Encounter (Signed)
[facility] is requesting to fill pt prednisone . Pt was sent a my chart message advising of the need for an appointment. Please advise [facility]

## 2021-07-18 ENCOUNTER — Telehealth: Payer: Self-pay | Admitting: Family Medicine

## 2021-07-18 NOTE — Telephone Encounter (Signed)
Medical records request fax returned via fax to [facility]. We do not have records for dates requested.  ?

## 2021-07-22 ENCOUNTER — Telehealth: Payer: Self-pay

## 2021-07-22 NOTE — Telephone Encounter (Signed)
[facility] called to advise that they will need a letter advising that pt does not have any records for service dates 08/17/20- 03/19/21 and 04/18/21 until present. Please advise if this can be done . [place] ?

## 2021-08-28 ENCOUNTER — Other Ambulatory Visit: Payer: Self-pay | Admitting: Family Medicine

## 2021-08-28 DIAGNOSIS — N529 Male erectile dysfunction, unspecified: Secondary | ICD-10-CM

## 2021-08-28 NOTE — Telephone Encounter (Signed)
[facility] is requesting to fill pt tadalafil. Please advise [facility] ?

## 2021-09-25 ENCOUNTER — Other Ambulatory Visit: Payer: Self-pay | Admitting: Family Medicine

## 2021-09-25 DIAGNOSIS — G7000 Myasthenia gravis without (acute) exacerbation: Secondary | ICD-10-CM

## 2021-09-25 NOTE — Telephone Encounter (Signed)
[facility] Is requesting to fill pt prednisone. Please advise [facility] ?

## 2021-12-10 ENCOUNTER — Other Ambulatory Visit: Payer: Self-pay | Admitting: Family Medicine

## 2021-12-10 DIAGNOSIS — N529 Male erectile dysfunction, unspecified: Secondary | ICD-10-CM

## 2021-12-21 ENCOUNTER — Other Ambulatory Visit: Payer: Self-pay | Admitting: Family Medicine

## 2021-12-21 DIAGNOSIS — N529 Male erectile dysfunction, unspecified: Secondary | ICD-10-CM

## 2022-01-22 ENCOUNTER — Encounter: Payer: Self-pay | Admitting: Internal Medicine

## 2022-01-24 ENCOUNTER — Ambulatory Visit (INDEPENDENT_AMBULATORY_CARE_PROVIDER_SITE_OTHER): Payer: 59 | Admitting: Family Medicine

## 2022-01-24 VITALS — BP 122/80 | HR 58 | Temp 97.3°F | Ht 70.0 in | Wt 241.4 lb

## 2022-01-24 DIAGNOSIS — R7301 Impaired fasting glucose: Secondary | ICD-10-CM | POA: Diagnosis not present

## 2022-01-24 DIAGNOSIS — S76119D Strain of unspecified quadriceps muscle, fascia and tendon, subsequent encounter: Secondary | ICD-10-CM

## 2022-01-24 DIAGNOSIS — N529 Male erectile dysfunction, unspecified: Secondary | ICD-10-CM

## 2022-01-24 DIAGNOSIS — Z1211 Encounter for screening for malignant neoplasm of colon: Secondary | ICD-10-CM

## 2022-01-24 DIAGNOSIS — Z23 Encounter for immunization: Secondary | ICD-10-CM | POA: Diagnosis not present

## 2022-01-24 DIAGNOSIS — G7000 Myasthenia gravis without (acute) exacerbation: Secondary | ICD-10-CM

## 2022-01-24 DIAGNOSIS — E785 Hyperlipidemia, unspecified: Secondary | ICD-10-CM | POA: Diagnosis not present

## 2022-01-24 DIAGNOSIS — E669 Obesity, unspecified: Secondary | ICD-10-CM

## 2022-01-24 DIAGNOSIS — Z0000 Encounter for general adult medical examination without abnormal findings: Secondary | ICD-10-CM

## 2022-01-24 DIAGNOSIS — Z79899 Other long term (current) drug therapy: Secondary | ICD-10-CM | POA: Insufficient documentation

## 2022-01-24 LAB — POCT GLYCOSYLATED HEMOGLOBIN (HGB A1C): Hemoglobin A1C: 5.5 % (ref 4.0–5.6)

## 2022-01-24 MED ORDER — PREDNISONE 10 MG PO TABS: 10.0000 mg | ORAL_TABLET | Freq: Two times a day (BID) | ORAL | 3 refills | Status: DC

## 2022-01-24 MED ORDER — TADALAFIL 20 MG PO TABS: ORAL_TABLET | ORAL | 2 refills | Status: DC

## 2022-01-24 NOTE — Progress Notes (Signed)
   Subjective:    Patient ID: Jay Rosario, male    DOB: [birth year], 56 y.o.   MRN: [id number]  HPI He is here for complete examination.  He recently had a quad tendon repair bilaterally and seems to be recovering nicely from that.  He continues on prednisone for his underlying myasthenia gravis and is having no difficulty with that.  He would also like to continue on Cialis which works quite well for him.  Otherwise he has no particular concerns or complaints.  Family and social history as well as health maintenance and immunizations was reviewed. The medical record also shows some elevated blood sugars.  Review of Systems  All other systems reviewed and are negative.      Objective:   Physical Exam Alert and in no distress. Tympanic membranes and canals are normal. Pharyngeal area is normal. Neck is supple without adenopathy or thyromegaly. Cardiac exam shows a regular sinus rhythm without murmurs or gallops. Lungs are clear to auscultation.        Assessment & Plan:   Routine general medical examination at a health care facility  Need for influenza vaccination - Plan: Flu Vaccine QUAD 15mo+IM (Fluarix, Fluzone & Alfiuria Quad PF)  Need for shingles vaccine - Plan: Varicella-zoster vaccine IM  Screening for colon cancer - Plan: Cologuard  Rupture of quadriceps muscle, unspecified laterality, subsequent encounter  Erectile dysfunction, unspecified erectile dysfunction type - Plan: tadalafil (CIALIS) 20 MG tablet  Hyperlipidemia with target LDL less than 130 - Plan: Lipid panel  Obesity (BMI 30-39.9) - Plan: CBC with Differential/Platelet, Comprehensive metabolic panel, Lipid panel  Myasthenia gravis (HCC) - Plan: predniSONE (DELTASONE) 10 MG tablet  Impaired fasting glucose - Plan: HgB A1c  Encounter for long-term (current) use of high-risk medication He will continue his present medications.  I then discussed his diet and exercise with him.  Strongly encouraged him to  make further changes in his diet to help with weight reduction.

## 2022-01-25 LAB — LIPID PANEL
Chol/HDL Ratio: 3.8 ratio (ref 0.0–5.0)
Cholesterol, Total: 252 mg/dL — ABNORMAL HIGH (ref 100–199)
HDL: 66 mg/dL (ref >39)
LDL Chol Calc (NIH): 174 mg/dL — ABNORMAL HIGH (ref 0–99)
Triglycerides: 72 mg/dL (ref 0–149)
VLDL Cholesterol Cal: 12 mg/dL (ref 5–40)

## 2022-01-25 LAB — COMPREHENSIVE METABOLIC PANEL
ALT: 40 IU/L (ref 0–44)
AST: 28 IU/L (ref 0–40)
Albumin/Globulin Ratio: 1.6 (ref 1.2–2.2)
Albumin: 4.4 g/dL (ref 3.8–4.9)
Alkaline Phosphatase: 62 IU/L (ref 44–121)
BUN/Creatinine Ratio: 17 (ref 9–20)
BUN: 14 mg/dL (ref 6–24)
Bilirubin Total: 0.3 mg/dL (ref 0.0–1.2)
CO2: 17 mmol/L — ABNORMAL LOW (ref 20–29)
Calcium: 9.2 mg/dL (ref 8.7–10.2)
Chloride: 105 mmol/L (ref 96–106)
Creatinine, Ser: 0.82 mg/dL (ref 0.76–1.27)
Globulin, Total: 2.8 g/dL (ref 1.5–4.5)
Glucose: 100 mg/dL — ABNORMAL HIGH (ref 70–99)
Potassium: 4.4 mmol/L (ref 3.5–5.2)
Sodium: 139 mmol/L (ref 134–144)
Total Protein: 7.2 g/dL (ref 6.0–8.5)
eGFR: 104 mL/min/{1.73_m2} (ref >59)

## 2022-01-25 LAB — CBC WITH DIFFERENTIAL/PLATELET
Basophils Absolute: 0 10*3/uL (ref 0.0–0.2)
Basos: 1 %
EOS (ABSOLUTE): 0 10*3/uL (ref 0.0–0.4)
Eos: 1 %
Hematocrit: 48.7 % (ref 37.5–51.0)
Hemoglobin: 16.6 g/dL (ref 13.0–17.7)
Immature Grans (Abs): 0 10*3/uL (ref 0.0–0.1)
Immature Granulocytes: 1 %
Lymphocytes Absolute: 1 10*3/uL (ref 0.7–3.1)
Lymphs: 23 %
MCH: 32.1 pg (ref 26.6–33.0)
MCHC: 34.1 g/dL (ref 31.5–35.7)
MCV: 94 fL (ref 79–97)
Monocytes Absolute: 0.4 10*3/uL (ref 0.1–0.9)
Monocytes: 9 %
Neutrophils Absolute: 2.9 10*3/uL (ref 1.4–7.0)
Neutrophils: 65 %
Platelets: 196 10*3/uL (ref 150–450)
RBC: 5.17 x10E6/uL (ref 4.14–5.80)
RDW: 12.2 % (ref 11.6–15.4)
WBC: 4.4 10*3/uL (ref 3.4–10.8)

## 2022-01-27 NOTE — Progress Notes (Signed)
Lvm for pt to call back for labs . [facility]

## 2022-02-07 LAB — COLOGUARD: COLOGUARD: NEGATIVE

## 2022-02-25 ENCOUNTER — Encounter: Payer: Self-pay | Admitting: Internal Medicine

## 2022-03-10 ENCOUNTER — Encounter: Payer: Self-pay | Admitting: Internal Medicine

## 2022-03-25 ENCOUNTER — Other Ambulatory Visit: Payer: Self-pay | Admitting: Family Medicine

## 2022-03-25 DIAGNOSIS — N529 Male erectile dysfunction, unspecified: Secondary | ICD-10-CM

## 2022-03-25 NOTE — Telephone Encounter (Signed)
Is this okay to refill? 

## 2022-05-26 ENCOUNTER — Ambulatory Visit: Payer: 59 | Admitting: Nurse Practitioner

## 2022-05-26 ENCOUNTER — Encounter: Payer: Self-pay | Admitting: Nurse Practitioner

## 2022-05-26 VITALS — BP 124/80 | HR 57 | Temp 98.3°F | Wt 241.4 lb

## 2022-05-26 DIAGNOSIS — G7000 Myasthenia gravis without (acute) exacerbation: Secondary | ICD-10-CM

## 2022-05-26 DIAGNOSIS — B360 Pityriasis versicolor: Secondary | ICD-10-CM | POA: Diagnosis not present

## 2022-05-26 DIAGNOSIS — R82998 Other abnormal findings in urine: Secondary | ICD-10-CM | POA: Diagnosis not present

## 2022-05-26 DIAGNOSIS — R399 Unspecified symptoms and signs involving the genitourinary system: Secondary | ICD-10-CM | POA: Insufficient documentation

## 2022-05-26 LAB — POCT URINALYSIS DIP (CLINITEK)
Bilirubin, UA: NEGATIVE
Blood, UA: NEGATIVE
Glucose, UA: NEGATIVE mg/dL
Ketones, POC UA: NEGATIVE mg/dL
Nitrite, UA: NEGATIVE
POC PROTEIN,UA: NEGATIVE
Spec Grav, UA: 1.025 (ref 1.010–1.025)
Urobilinogen, UA: 0.2 E.U./dL
pH, UA: 6 (ref 5.0–8.0)

## 2022-05-26 MED ORDER — TAMSULOSIN HCL 0.4 MG PO CAPS: 0.4000 mg | ORAL_CAPSULE | Freq: Every day | ORAL | 11 refills | Status: DC

## 2022-05-26 MED ORDER — KETOCONAZOLE 2 % EX SHAM: 1.0000 | MEDICATED_SHAMPOO | CUTANEOUS | 4 refills | Status: DC

## 2022-05-26 MED ORDER — KETOCONAZOLE 2 % EX CREA: 1.0000 | TOPICAL_CREAM | Freq: Two times a day (BID) | CUTANEOUS | 3 refills | Status: DC

## 2022-05-26 MED ORDER — SULFAMETHOXAZOLE-TRIMETHOPRIM 800-160 MG PO TABS: 1.0000 | ORAL_TABLET | Freq: Two times a day (BID) | ORAL | 0 refills | Status: DC

## 2022-05-26 NOTE — Assessment & Plan Note (Signed)
Symptoms and presentation consistent with tinea infection. Patient states these have been present for many years. Recommend treatment with ketoconazole shampoo as body wash twice a week and topical ketoconazole cream to affected areas twice daily. Given the widespread distribution, he may need oral treatment to completely clear the infection, but will trial topical first to reduce the risks of side effects. F/U if no improvement in the next 3-4 weeks.

## 2022-05-26 NOTE — Patient Instructions (Addendum)
I have sent in a medication called flomax to help with the urinary symptoms. If this is not helping in the next two weeks, please let us know.   I have sent in the shampoo to use as body wash and the cream for the tinea skin infection. This will help get rid of the rash

## 2022-05-26 NOTE — Progress Notes (Signed)
Jay Render, DNP, AGNP-c Jay Rosario, [place] [id number] [phone]  Subjective:   Jay Rosario is a 57 y.o. male presents to day for evaluation of: Urinary Frequency [name] endorses increased urination happening mostly at night. He is not sure if it related to increase in prednisone due to myasthenia gravis. He tells me he ran out of his prednisone and was off for a couple of days and started to have issues with his eyes. He has since restarted his prednisone and has been taking a higher dose than usual to help with his vision. He has not had any dysuria, suprapubic pain, fevers, chills, or low back pain. He tells me he feels like he is not completely emptying when urinating mostly at night. He tells me that he has not had a strong stream for a while. No swelling in the feet or legs, no cough, or shortness of breath.   PMH, Medications, and Allergies reviewed and updated in chart as appropriate.   ROS negative except for what is listed in HPI. Objective:  BP 124/80   Pulse (!) 57   Temp 98.3 F (36.8 C)   Wt 241 lb 6.4 oz (109.5 kg)   BMI 34.64 kg/m  Physical Exam Vitals and nursing note reviewed.  Constitutional:      Appearance: Normal appearance.  HENT:     Head: Normocephalic.  Eyes:     Pupils: Pupils are equal, round, and reactive to light.     Comments: Squinting eyes.  Cardiovascular:     Rate and Rhythm: Normal rate and regular rhythm.     Pulses: Normal pulses.     Heart sounds: Normal heart sounds.  Pulmonary:     Effort: Pulmonary effort is normal.     Breath sounds: Normal breath sounds.  Abdominal:     General: Bowel sounds are normal. There is no distension.     Palpations: Abdomen is soft. There is no mass.     Tenderness: There is no abdominal tenderness. There is no right CVA tenderness, left CVA tenderness, guarding or rebound.  Musculoskeletal:     Cervical back: Normal range of motion.  Skin:     General: Skin is warm and dry.     Capillary Refill: Capillary refill takes less than 2 seconds.     Findings: Rash present.     Comments: hypopigmented finely scaled macules scattered on the posterior surface of the neck, occipital region of the head, and the back.   Neurological:     General: No focal deficit present.     Mental Status: He is alert and oriented to person, place, and time.  Psychiatric:        Mood and Affect: Mood normal.           Assessment & Plan:   Problem List Items Addressed This Visit     Myasthenia gravis ([place])    Ocular symptoms associated with myasthenia gravis present at this time. He is back on his prednisone. There are no alarm symptoms present. Recommend he follow-up with his regular provider if his symptoms fail to improve to baseline with continued treatment in the next week.       Lower urinary tract symptoms (LUTS) - Primary    Discussion with patient about symptoms present today. I strongly suspect that there is a component of prostatic involvement given the long term decrease in urinary stream and the symptoms present today. UA does show leucocytes present. Given  the current findings, we will begin treatment with tamsulosin and I will also send in short course of antibiotic therapy given his symptoms and the UA results. If no improvement of symptoms, recommend follow-up. PSA pending today. Given his history of myasthenia gravis, want to watch this carefully.       Relevant Medications   tamsulosin (FLOMAX) 0.4 MG CAPS capsule   Other Relevant Orders   PSA, total and free   POCT URINALYSIS DIP (CLINITEK) (Completed)   Tinea versicolor    Symptoms and presentation consistent with tinea infection. Patient states these have been present for many years. Recommend treatment with ketoconazole shampoo as body wash twice a week and topical ketoconazole cream to affected areas twice daily. Given the widespread distribution, he may need oral treatment to  completely clear the infection, but will trial topical first to reduce the risks of side effects. F/U if no improvement in the next 3-4 weeks.       Relevant Medications   ketoconazole (NIZORAL) 2 % shampoo   ketoconazole (NIZORAL) 2 % cream   sulfamethoxazole-trimethoprim (BACTRIM DS) 800-160 MG tablet   Other Visit Diagnoses     Leukocytes in urine       Relevant Medications   sulfamethoxazole-trimethoprim (BACTRIM DS) 800-160 MG tablet         Jay Eth, DNP, AGNP-c 05/26/2022  8:38 PM    History, Medications, Surgery, SDOH, and Family History reviewed and updated as appropriate.

## 2022-05-26 NOTE — Assessment & Plan Note (Addendum)
Ocular symptoms associated with myasthenia gravis present at this time. He is back on his prednisone. There are no alarm symptoms present. Recommend he follow-up with his regular provider if his symptoms fail to improve to baseline with continued treatment in the next week.

## 2022-05-26 NOTE — Assessment & Plan Note (Addendum)
Discussion with patient about symptoms present today. I strongly suspect that there is a component of prostatic involvement given the long term decrease in urinary stream and the symptoms present today. UA does show leucocytes present. Given the current findings, we will begin treatment with tamsulosin and I will also send in short course of antibiotic therapy given his symptoms and the UA results. If no improvement of symptoms, recommend follow-up. PSA pending today. Given his history of myasthenia gravis, want to watch this carefully.

## 2022-05-27 LAB — PSA, TOTAL AND FREE
PSA, Free Pct: 35 %
PSA, Free: 0.14 ng/mL
Prostate Specific Ag, Serum: 0.4 ng/mL (ref 0.0–4.0)

## 2022-08-23 ENCOUNTER — Other Ambulatory Visit: Payer: Self-pay | Admitting: Family Medicine

## 2022-08-23 DIAGNOSIS — N529 Male erectile dysfunction, unspecified: Secondary | ICD-10-CM

## 2022-08-25 NOTE — Telephone Encounter (Signed)
Is this okay to refill? 

## 2022-09-17 ENCOUNTER — Other Ambulatory Visit: Payer: Self-pay | Admitting: Family Medicine

## 2022-09-17 DIAGNOSIS — N529 Male erectile dysfunction, unspecified: Secondary | ICD-10-CM

## 2022-09-19 ENCOUNTER — Other Ambulatory Visit: Payer: Self-pay | Admitting: Family Medicine

## 2022-09-19 DIAGNOSIS — N529 Male erectile dysfunction, unspecified: Secondary | ICD-10-CM

## 2022-10-14 ENCOUNTER — Other Ambulatory Visit (HOSPITAL_BASED_OUTPATIENT_CLINIC_OR_DEPARTMENT_OTHER): Payer: Self-pay

## 2023-01-29 ENCOUNTER — Encounter: Payer: 59 | Admitting: Family Medicine

## 2023-01-30 ENCOUNTER — Encounter: Payer: 59 | Admitting: Family Medicine

## 2023-03-05 ENCOUNTER — Encounter: Payer: Self-pay | Admitting: Family Medicine

## 2023-03-05 ENCOUNTER — Ambulatory Visit (INDEPENDENT_AMBULATORY_CARE_PROVIDER_SITE_OTHER): Payer: 59 | Admitting: Family Medicine

## 2023-03-05 VITALS — BP 126/80 | HR 52 | Ht 69.0 in | Wt 231.2 lb

## 2023-03-05 DIAGNOSIS — Z1159 Encounter for screening for other viral diseases: Secondary | ICD-10-CM | POA: Diagnosis not present

## 2023-03-05 DIAGNOSIS — N529 Male erectile dysfunction, unspecified: Secondary | ICD-10-CM | POA: Diagnosis not present

## 2023-03-05 DIAGNOSIS — E785 Hyperlipidemia, unspecified: Secondary | ICD-10-CM

## 2023-03-05 DIAGNOSIS — Z0000 Encounter for general adult medical examination without abnormal findings: Secondary | ICD-10-CM | POA: Diagnosis not present

## 2023-03-05 DIAGNOSIS — S76119D Strain of unspecified quadriceps muscle, fascia and tendon, subsequent encounter: Secondary | ICD-10-CM

## 2023-03-05 DIAGNOSIS — Z79899 Other long term (current) drug therapy: Secondary | ICD-10-CM

## 2023-03-05 DIAGNOSIS — Z7952 Long term (current) use of systemic steroids: Secondary | ICD-10-CM

## 2023-03-05 DIAGNOSIS — Z23 Encounter for immunization: Secondary | ICD-10-CM

## 2023-03-05 DIAGNOSIS — G7000 Myasthenia gravis without (acute) exacerbation: Secondary | ICD-10-CM | POA: Diagnosis not present

## 2023-03-05 DIAGNOSIS — E669 Obesity, unspecified: Secondary | ICD-10-CM

## 2023-03-05 DIAGNOSIS — N5201 Erectile dysfunction due to arterial insufficiency: Secondary | ICD-10-CM

## 2023-03-05 MED ORDER — TADALAFIL 20 MG PO TABS: ORAL_TABLET | ORAL | 5 refills | Status: DC

## 2023-03-05 MED ORDER — PREDNISONE 10 MG PO TABS: 10.0000 mg | ORAL_TABLET | Freq: Two times a day (BID) | ORAL | 3 refills | Status: DC

## 2023-03-05 NOTE — Progress Notes (Addendum)
Complete physical exam  Patient: Jay Rosario   DOB: 17-Feb-1966   57 y.o. Male  MRN: [id number]  Subjective:    No chief complaint on file.   Jay Rosario is a 57 y.o. male who presents today for a complete physical exam. He reports consuming a general diet. The patient has a physically strenuous job, but has no regular exercise apart from work.  He generally feels fairly well. He reports sleeping poorly. He does not have additional problems to discuss today.  He does have a history of myasthenia gravis and is quite stable on 10 mg of prednisone on a daily basis.  He wants to continue on it.  Apparently in the past he has tried other therapies with little success.  He uses Cialis on an as-needed basis.  He does note that it also helps with urinary symptoms.  He has lost about 10 pounds due to a change in his job.  His work and home life are going well.   Most recent fall risk assessment:    03/05/2023    3:34 PM  Fall Risk   Falls in the past year? 0  Number falls in past yr: 0  Injury with Fall? 0     Most recent depression screenings:    03/05/2023    3:34 PM 11/23/2019    8:10 AM  PHQ 2/9 Scores  PHQ - 2 Score 0 0    Vision:Not within last year  and Dental: No regular dental care   The 10-year ASCVD risk score (Arnett DK, et al., 2019) is: 6.1%   Values used to calculate the score:     Age: 72 years     Sex: Male     Is Non-Hispanic African American: Yes     Diabetic: No     Tobacco smoker: No     Systolic Blood Pressure: 126 mmHg     Is BP treated: No     HDL Cholesterol: 89 mg/dL     Total Cholesterol: 283 mg/dL   Patient Care Team: Ronnald Nian, MD as PCP - General (Family Medicine)   Outpatient Medications Prior to Visit  Medication Sig   [DISCONTINUED] predniSONE (DELTASONE) 10 MG tablet Take 1 tablet (10 mg total) by mouth 2 (two) times daily.   [DISCONTINUED] tadalafil (CIALIS) 20 MG tablet TAKE 1 TABLET BY MOUTH DAILY AS NEEDED FOR ERECTILE  DYSFUNCTION   apixaban (ELIQUIS) 2.5 MG TABS tablet Take 1 tablet (2.5 mg total) by mouth 2 (two) times daily. (Patient not taking: Reported on 01/24/2022)   ketoconazole (NIZORAL) 2 % cream Apply 1 Application topically 2 (two) times daily. To affected areas. (Patient not taking: Reported on 03/05/2023)   ketoconazole (NIZORAL) 2 % shampoo Apply 1 Application topically 2 (two) times a week. (Patient not taking: Reported on 03/05/2023)   sulfamethoxazole-trimethoprim (BACTRIM DS) 800-160 MG tablet Take 1 tablet by mouth 2 (two) times daily. (Patient not taking: Reported on 03/05/2023)   tamsulosin (FLOMAX) 0.4 MG CAPS capsule Take 1 capsule (0.4 mg total) by mouth daily after breakfast. (Patient not taking: Reported on 03/05/2023)   No facility-administered medications prior to visit.    Review of Systems  All other systems reviewed and are negative.  Family and social history as well as health maintenance and immunizations was reviewed       Objective:       Physical Exam  Alert and in no distress. Tympanic membranes and canals are normal. Pharyngeal area  is normal. Neck is supple without adenopathy or thyromegaly. Cardiac exam shows a regular sinus rhythm without murmurs or gallops. Lungs are clear to auscultation.      Assessment & Plan:   Routine general medical examination at a health care facility  Encounter for long-term (current) use of high-risk medication - Plan: CBC with Differential/Platelet, Comprehensive metabolic panel, Lipid panel  Erectile dysfunction due to arterial insufficiency  Myasthenia gravis (HCC) - Plan: predniSONE (DELTASONE) 10 MG tablet  Obesity (BMI 30-39.9) - Plan: CBC with Differential/Platelet, Comprehensive metabolic panel, Lipid panel  Hyperlipidemia with target LDL less than 130 - Plan: Lipid panel  Rupture of quadriceps muscle, unspecified laterality, subsequent encounter - 2022 surgery  Erectile dysfunction, unspecified erectile  dysfunction type - Plan: tadalafil (CIALIS) 20 MG tablet  Need for hepatitis C screening test - Plan: Hepatitis C antibody  Need for influenza vaccination - Plan: Flu vaccine trivalent PF, 6mos and older(Flulaval,Afluria,Fluarix,Fluzone)  Need for shingles vaccine - Plan: Zoster Recombinant (Shingrix ), CANCELED: Varicella-zoster vaccine IM  On prednisone therapy - Plan: DG Bone Density  Immunization History  Administered Date(s) Administered   DTaP 08/08/2003   Influenza Split 02/20/2011   Influenza Whole 03/12/2010   Influenza, Seasonal, Injecte, Preservative Fre 03/05/2023   Influenza,inj,Quad PF,6+ Mos 01/03/2016, 01/24/2022   PFIZER Comirnaty(Gray Top)Covid-19 Tri-Sucrose Vaccine 12/04/2019, 01/03/2020   Tdap 06/21/2015   Zoster Recombinant(Shingrix) 01/24/2022, 03/05/2023    Health Maintenance  Topic Date Due   Hepatitis C Screening  Never done   COVID-19 Vaccine (3 - Pfizer risk series) 03/21/2023 (Originally 01/31/2020)   Fecal DNA (Cologuard)  02/02/2025   DTaP/Tdap/Td (3 - Td or Tdap) 06/20/2025   INFLUENZA VACCINE  Completed   HIV Screening  Completed   Zoster Vaccines- Shingrix  Completed   HPV VACCINES  Aged Out    Discussed the myasthenia gravis diagnosis and since he states he had tried other medicines in the past he is comfortable being on prednisone.  I will order a bone density scan to be safe.  Problem List Items Addressed This Visit     Encounter for long-term (current) use of high-risk medication   Relevant Orders   CBC with Differential/Platelet   Comprehensive metabolic panel   Lipid panel   Erectile dysfunction   Relevant Medications   tadalafil (CIALIS) 20 MG tablet   Hyperlipidemia with target LDL less than 130   Relevant Medications   tadalafil (CIALIS) 20 MG tablet   Other Relevant Orders   Lipid panel   Myasthenia gravis (HCC)   Relevant Medications   predniSONE (DELTASONE) 10 MG tablet   Obesity (BMI 30-39.9)   Relevant Orders   CBC  with Differential/Platelet   Comprehensive metabolic panel   Lipid panel   Other Visit Diagnoses     Routine general medical examination at a health care facility    -  Primary   Rupture of quadriceps muscle, unspecified laterality, subsequent encounter       2022 surgery   Need for hepatitis C screening test       Relevant Orders   Hepatitis C antibody   Need for influenza vaccination       Relevant Orders   Flu vaccine trivalent PF, 6mos and older(Flulaval,Afluria,Fluarix,Fluzone) (Completed)   Need for shingles vaccine       Relevant Orders   Zoster Recombinant (Shingrix ) (Completed)   On prednisone therapy       Relevant Orders   DG Bone Density      Follow-up  1 year    Sharlot Gowda, MD

## 2023-03-06 LAB — CBC WITH DIFFERENTIAL/PLATELET
Basophils Absolute: 0 10*3/uL (ref 0.0–0.2)
Basos: 1 %
EOS (ABSOLUTE): 0 10*3/uL (ref 0.0–0.4)
Eos: 1 %
Hematocrit: 49.1 % (ref 37.5–51.0)
Hemoglobin: 16.6 g/dL (ref 13.0–17.7)
Immature Grans (Abs): 0 10*3/uL (ref 0.0–0.1)
Immature Granulocytes: 1 %
Lymphocytes Absolute: 2.3 10*3/uL (ref 0.7–3.1)
Lymphs: 36 %
MCH: 32 pg (ref 26.6–33.0)
MCHC: 33.8 g/dL (ref 31.5–35.7)
MCV: 95 fL (ref 79–97)
Monocytes Absolute: 0.8 10*3/uL (ref 0.1–0.9)
Monocytes: 12 %
Neutrophils Absolute: 3.3 10*3/uL (ref 1.4–7.0)
Neutrophils: 49 %
Platelets: 214 10*3/uL (ref 150–450)
RBC: 5.18 x10E6/uL (ref 4.14–5.80)
RDW: 12.2 % (ref 11.6–15.4)
WBC: 6.5 10*3/uL (ref 3.4–10.8)

## 2023-03-06 LAB — HEPATITIS C ANTIBODY: Hep C Virus Ab: NONREACTIVE

## 2023-03-06 LAB — COMPREHENSIVE METABOLIC PANEL
ALT: 35 [IU]/L (ref 0–44)
AST: 25 [IU]/L (ref 0–40)
Albumin: 4.4 g/dL (ref 3.8–4.9)
Alkaline Phosphatase: 53 [IU]/L (ref 44–121)
BUN/Creatinine Ratio: 16 (ref 9–20)
BUN: 15 mg/dL (ref 6–24)
Bilirubin Total: 0.5 mg/dL (ref 0.0–1.2)
CO2: 22 mmol/L (ref 20–29)
Calcium: 9.3 mg/dL (ref 8.7–10.2)
Chloride: 104 mmol/L (ref 96–106)
Creatinine, Ser: 0.96 mg/dL (ref 0.76–1.27)
Globulin, Total: 2.6 g/dL (ref 1.5–4.5)
Glucose: 77 mg/dL (ref 70–99)
Potassium: 4 mmol/L (ref 3.5–5.2)
Sodium: 140 mmol/L (ref 134–144)
Total Protein: 7 g/dL (ref 6.0–8.5)
eGFR: 93 mL/min/{1.73_m2} (ref >59)

## 2023-03-06 LAB — LIPID PANEL
Chol/HDL Ratio: 3.2 {ratio} (ref 0.0–5.0)
Cholesterol, Total: 283 mg/dL — ABNORMAL HIGH (ref 100–199)
HDL: 89 mg/dL (ref >39)
LDL Chol Calc (NIH): 182 mg/dL — ABNORMAL HIGH (ref 0–99)
Triglycerides: 74 mg/dL (ref 0–149)
VLDL Cholesterol Cal: 12 mg/dL (ref 5–40)

## 2023-03-28 ENCOUNTER — Other Ambulatory Visit: Payer: Self-pay | Admitting: Family Medicine

## 2023-03-28 DIAGNOSIS — G7000 Myasthenia gravis without (acute) exacerbation: Secondary | ICD-10-CM

## 2023-04-25 ENCOUNTER — Other Ambulatory Visit: Payer: Self-pay | Admitting: Family Medicine

## 2023-04-25 DIAGNOSIS — N529 Male erectile dysfunction, unspecified: Secondary | ICD-10-CM

## 2023-04-28 DIAGNOSIS — N181 Chronic kidney disease, stage 1: Secondary | ICD-10-CM | POA: Diagnosis not present

## 2023-04-28 DIAGNOSIS — E785 Hyperlipidemia, unspecified: Secondary | ICD-10-CM | POA: Diagnosis not present

## 2023-04-28 DIAGNOSIS — Z6833 Body mass index (BMI) 33.0-33.9, adult: Secondary | ICD-10-CM | POA: Diagnosis not present

## 2023-04-28 DIAGNOSIS — E669 Obesity, unspecified: Secondary | ICD-10-CM | POA: Diagnosis not present

## 2023-04-28 DIAGNOSIS — Z7952 Long term (current) use of systemic steroids: Secondary | ICD-10-CM | POA: Diagnosis not present

## 2023-04-28 DIAGNOSIS — G7000 Myasthenia gravis without (acute) exacerbation: Secondary | ICD-10-CM | POA: Diagnosis not present

## 2023-07-14 ENCOUNTER — Encounter: Payer: Self-pay | Admitting: Internal Medicine

## 2023-10-30 ENCOUNTER — Other Ambulatory Visit: Payer: BC Managed Care – PPO

## 2023-11-14 IMAGING — MR MR KNEE*R* W/O CM
4 of 6 series · 19 of 40 positions shown · non-contrast
Comparison: None.

CLINICAL DATA: suspect quadriceps tendon rupture

EXAM:
MRI OF THE RIGHT KNEE WITHOUT CONTRAST
TECHNIQUE: Multiplanar, multisequence MR imaging of the knee was performed. No
intravenous contrast was administered.

[Series 2: T2 fat-sat · axial · 4.0mm · 0.31mm/px · z∈[-52,+43]mm · 3 of 24 slices shown (1 of 2)]
[im 5/24]
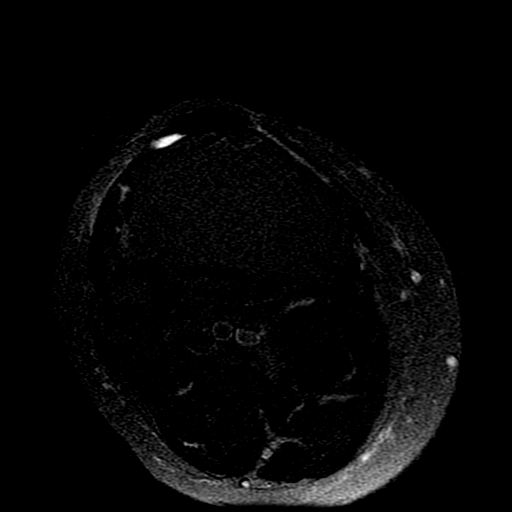
[im 14/24]
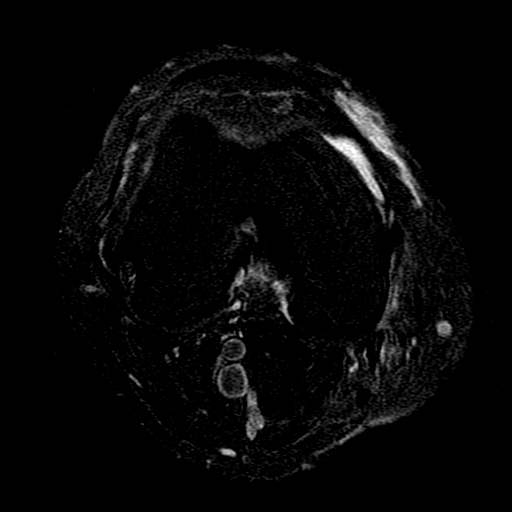
[im 24/24]
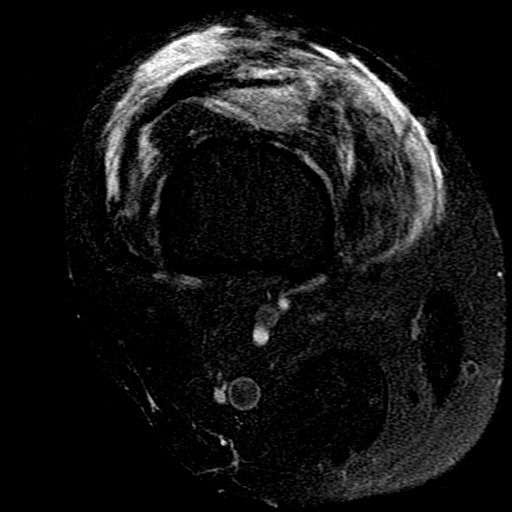

[Series 4: T2 fat-sat · coronal · 4.0mm · 0.31mm/px · 3 of 24 slices shown (2 of 2)]
[im 4/24]
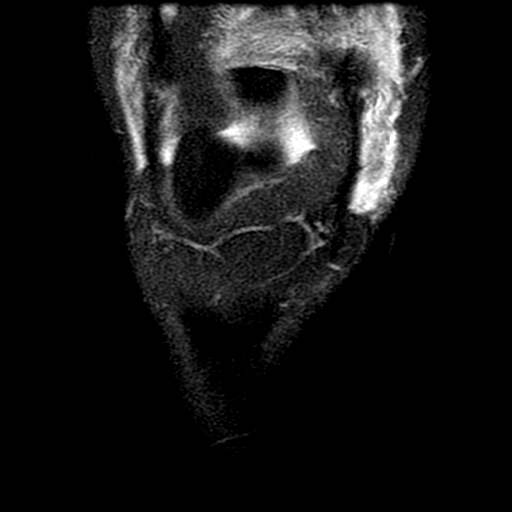
[im 12/24]
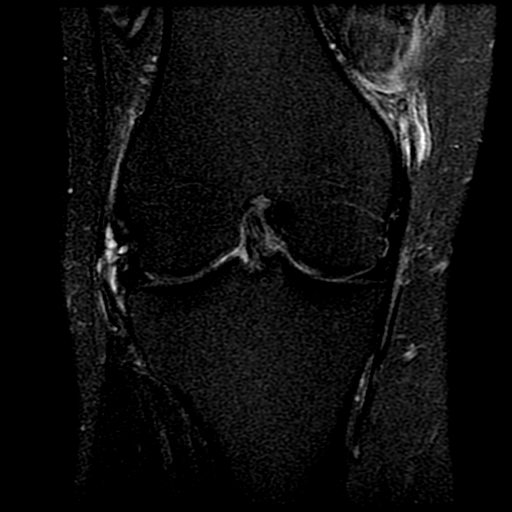
[im 20/24]
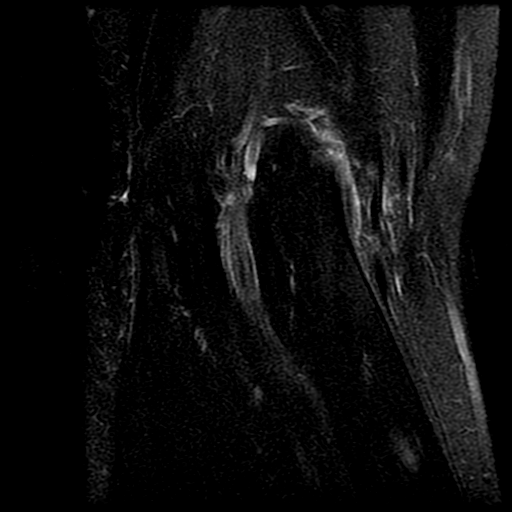

[Series 5: PD fat-sat · coronal · 4.0mm · 0.31mm/px · 7 of 24 slices shown (1 of 2)]
[im 1/24]
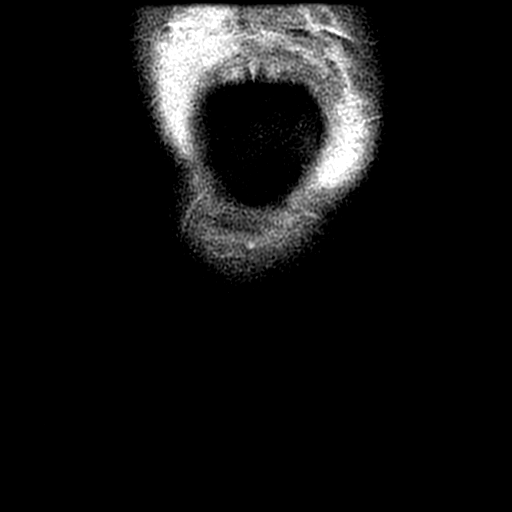
[im 4/24]
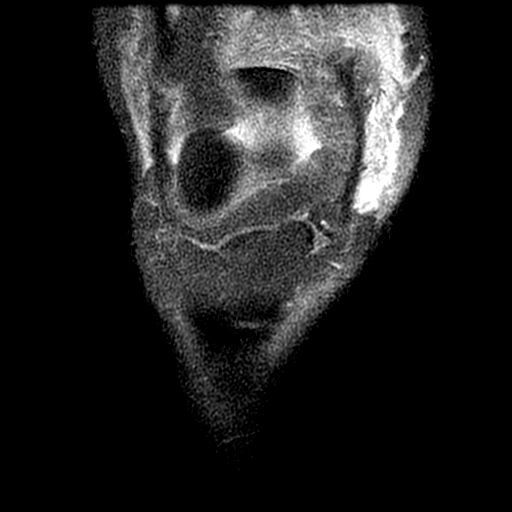
[im 8/24]
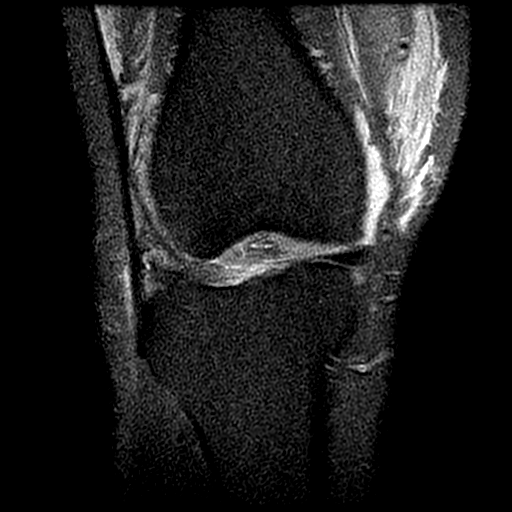
[im 12/24]
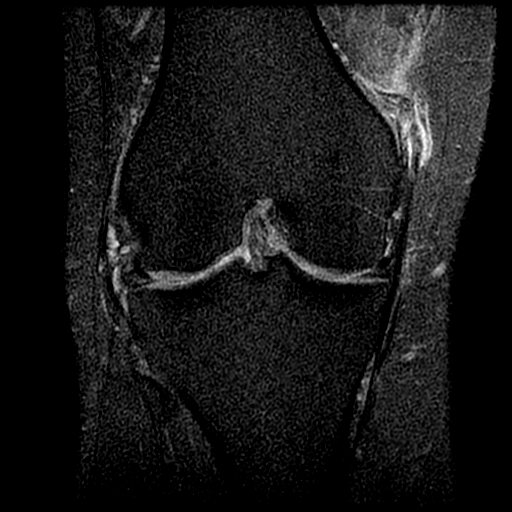
[im 16/24]
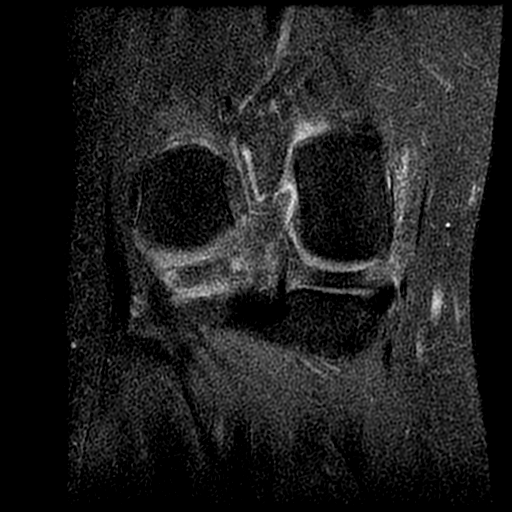
[im 20/24]
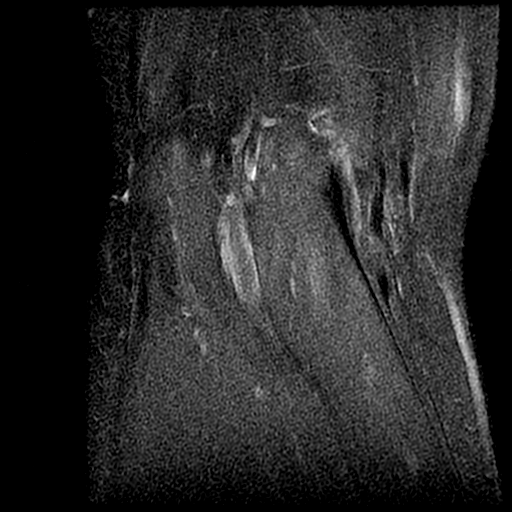
[im 24/24]
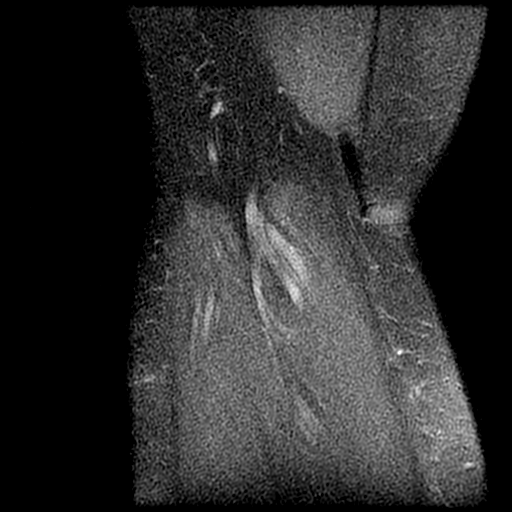

[Series 6: PD fat-sat · sagittal · 3.0mm · 0.31mm/px · 6 of 24 slices shown (2 of 2)]
[im 1/24]
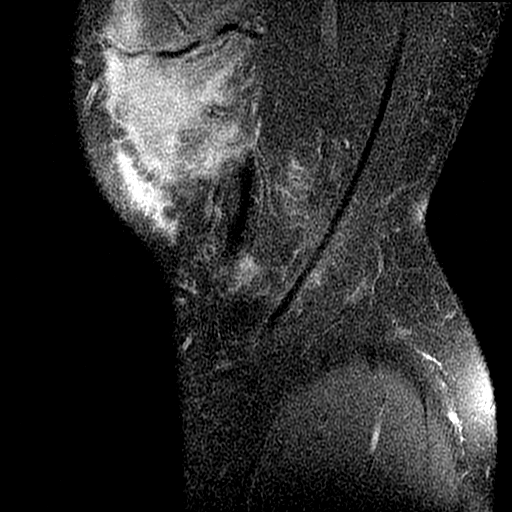
[im 4/24]
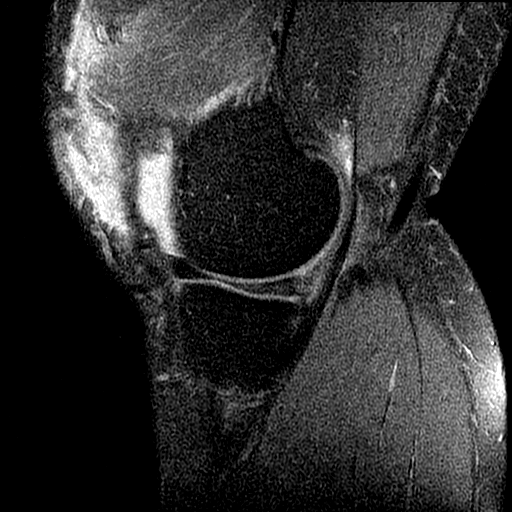
[im 8/24]
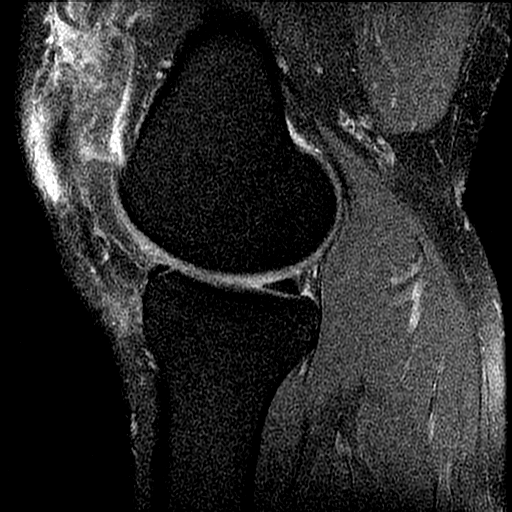
[im 12/24]
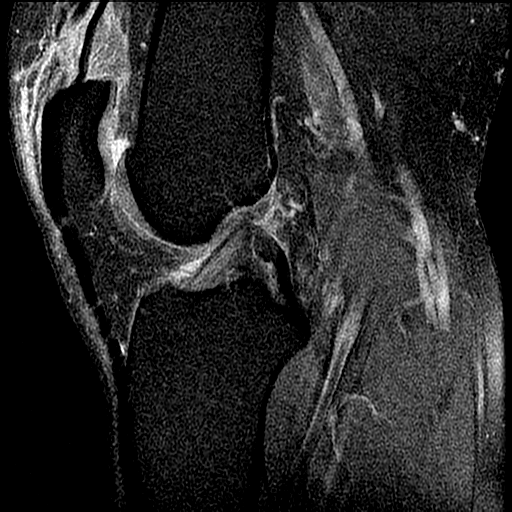
[im 16/24]
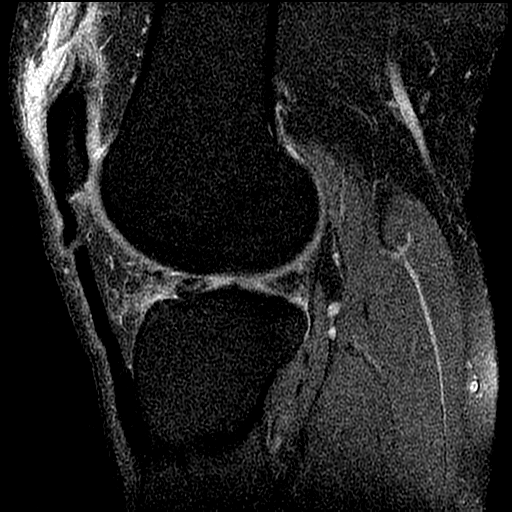
[im 20/24]
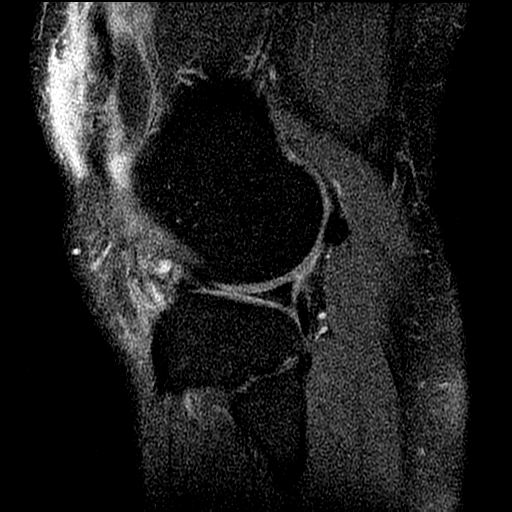

[19 of 40 positions shown; findings below may reference images not displayed]

FINDINGS: MENISCI

Medial: Intrasubstance degenerative signal without definitive
meniscus tear. Adjacent parameniscal cyst formation measuring 2.5 x
1.4 x 3.5 cm (coronal T2 image 14, axial T2 image 13).

Lateral: Nondisplaced horizontal tear of the anterior horn and body
of the lateral meniscus.

LIGAMENTS

Cruciates: ACL and PCL are intact.

Collaterals: Medial collateral ligament is intact. Lateral
collateral ligament complex is intact.

CARTILAGE

Patellofemoral:  Mild chondrosis.

Medial:  Mild chondrosis.

Lateral:  Mild chondrosis.

JOINT: Small joint effusion.

POPLITEAL FOSSA: Miniscule Baker cyst.

EXTENSOR MECHANISM: There is a high-grade, near complete tear of the
quadriceps tendon at the patellar insertion. The patellar tendon is
intact. The retinacula are intact.

BONES: No acute fracture or dislocation. No aggressive osseous
lesion. Tricompartment osteophyte formation. Extensive soft tissue
swelling along the suprapatellar knee.

Other: No fluid collection or hematoma. Muscles are normal.
IMPRESSION: High-grade, near complete tear of the quadriceps tendon at the
patellar insertion. Small joint effusion and extensive adjacent soft
tissue swelling.

Nondisplaced horizontal tear of the anterior horn and body of the
lateral meniscus with adjacent parameniscal cyst formation.

Mild tricompartment osteoarthritis.

## 2023-11-16 ENCOUNTER — Ambulatory Visit (HOSPITAL_BASED_OUTPATIENT_CLINIC_OR_DEPARTMENT_OTHER)
Admission: RE | Admit: 2023-11-16 | Discharge: 2023-11-16 | Disposition: A | Discharge status: Home / Self Care | Payer: Self-pay | Source: Ambulatory Visit | Attending: Family Medicine | Admitting: Family Medicine

## 2023-11-16 DIAGNOSIS — Z7952 Long term (current) use of systemic steroids: Secondary | ICD-10-CM | POA: Insufficient documentation

## 2023-11-17 ENCOUNTER — Ambulatory Visit: Payer: Self-pay | Admitting: Family Medicine

## 2023-11-17 DIAGNOSIS — M8580 Other specified disorders of bone density and structure, unspecified site: Secondary | ICD-10-CM | POA: Insufficient documentation

## 2024-02-13 ENCOUNTER — Ambulatory Visit (HOSPITAL_COMMUNITY)
Admission: EM | Admit: 2024-02-13 | Discharge: 2024-02-13 | Disposition: A | Discharge status: Home / Self Care | Attending: Emergency Medicine | Admitting: Emergency Medicine

## 2024-02-13 ENCOUNTER — Other Ambulatory Visit: Payer: Self-pay

## 2024-02-13 ENCOUNTER — Encounter (HOSPITAL_COMMUNITY): Payer: Self-pay | Admitting: *Deleted

## 2024-02-13 DIAGNOSIS — R531 Weakness: Secondary | ICD-10-CM | POA: Diagnosis not present

## 2024-02-13 DIAGNOSIS — W19XXXA Unspecified fall, initial encounter: Secondary | ICD-10-CM

## 2024-02-13 LAB — POCT FASTING CBG KUC MANUAL ENTRY: POCT Glucose (KUC): 131 mg/dL — AB (ref 70–99)

## 2024-02-13 NOTE — Discharge Instructions (Addendum)
 Please go directly to [facility] Emergency Department for further evaluation.

## 2024-02-13 NOTE — ED Notes (Signed)
 Patient is being discharged from the Urgent Care and sent to the Emergency Department via POV. Per Legrand Angle, PA, patient is in need of higher level of care due to right side weakness. Patient is aware and verbalizes understanding of plan of care.  Vitals:   02/13/24 1023  BP: (!) 136/92  Pulse: (!) 57  Resp: 20  Temp: 98 F (36.7 C)  SpO2: 93%

## 2024-02-13 NOTE — ED Provider Notes (Signed)
 [facility]    CSN: [id number] Arrival date & time: 02/13/24  1002      History   Chief Complaint Chief Complaint  Patient presents with   Fall    HPI Jay Rosario is a 58 y.o. male.  Patient with history of myasthenia gravis presents to urgent care this morning with concerns of a fall and weakness.  Patient reports that he has been feeling right arm weakness somewhat intermittently for the last week.  Reports he currently takes prednisone  daily for his myasthenia gravis.  He does feel that he may be having an acute flareup of his myasthenia as he feels that he has been having more eye straining and weakness as well as some double vision.  He reports that he had a fall while at church this morning try to do some chairs and felt that he had some weakness primarily to the right side and tried to push himself back up. Denies head strike, LOC, or injury to the head or neck from this fall.   Fall    Past Medical History:  Diagnosis Date   Dyslipidemia    Myasthenia gravis    Obesity    Positive TB test     Patient Active Problem List   Diagnosis Date Noted   Osteopenia 11/17/2023   Tinea versicolor 05/26/2022   Encounter for long-term (current) use of high-risk medication 01/24/2022   Quadriceps tendon rupture 04/29/2021   Elevated CK 04/29/2021   Prolonged QT interval 04/29/2021   Erectile dysfunction 12/29/2017   Hyperlipidemia with target LDL less than 130 09/06/2012   Obesity (BMI 30-39.9) 04/01/2011   Myasthenia gravis (HCC) 02/20/2011    Past Surgical History:  Procedure Laterality Date   cyst removed     upper left cyst removed from chest   TENDON REPAIR Bilateral 04/30/2021   Procedure: QUAD TENDON REPAIR;  Surgeon: Doll Skates, MD;  Location: [facility];  Service: Orthopedics;  Laterality: Bilateral;       Home Medications    Prior to Admission medications   Medication Sig Start Date End Date Taking? Authorizing Provider  predniSONE   (DELTASONE ) 10 MG tablet Take 1 tablet (10 mg total) by mouth 2 (two) times daily. 03/05/23  Yes Joyce Norleen BROCKS, MD  tadalafil  (CIALIS ) 20 MG tablet TAKE 1 TABLET BY MOUTH DAILY AS NEEDED FOR ERECTILE DYSFUNCTION 04/27/23  Yes Joyce Norleen BROCKS, MD  apixaban  (ELIQUIS ) 2.5 MG TABS tablet Take 1 tablet (2.5 mg total) by mouth 2 (two) times daily. Patient not taking: Reported on 01/24/2022 05/04/21 06/12/21  Swayze, Ava, DO  ketoconazole  (NIZORAL ) 2 % cream Apply 1 Application topically 2 (two) times daily. To affected areas. Patient not taking: Reported on 03/05/2023 05/26/22   Early, Sara E, NP  ketoconazole  (NIZORAL ) 2 % shampoo Apply 1 Application topically 2 (two) times a week. Patient not taking: Reported on 03/05/2023 05/26/22   Early, Sara E, NP  sulfamethoxazole -trimethoprim  (BACTRIM  DS) 800-160 MG tablet Take 1 tablet by mouth 2 (two) times daily. Patient not taking: Reported on 03/05/2023 05/26/22   Early, Camie FORBES, NP  tamsulosin  (FLOMAX ) 0.4 MG CAPS capsule Take 1 capsule (0.4 mg total) by mouth daily after breakfast. Patient not taking: Reported on 03/05/2023 05/26/22   Early, Camie FORBES, NP    Family History Family History  Problem Relation Age of Onset   Hypertension Mother     Social History Social History   Tobacco Use   Smoking status: Never   Smokeless tobacco:  Never  Vaping Use   Vaping status: Never Used  Substance Use Topics   Alcohol use: No   Drug use: No     Allergies   Patient has no known allergies.   Review of Systems Review of Systems  Neurological:  Positive for weakness.  All other systems reviewed and are negative.    Physical Exam Triage Vital Signs ED Triage Vitals  Encounter Vitals Group     BP 02/13/24 1023 (!) 136/92     Girls Systolic BP Percentile --      Girls Diastolic BP Percentile --      Boys Systolic BP Percentile --      Boys Diastolic BP Percentile --      Pulse Rate 02/13/24 1023 (!) 57     Resp 02/13/24 1023 20     Temp 02/13/24 1023  98 F (36.7 C)     Temp src --      SpO2 02/13/24 1023 93 %     Weight --      Height --      Head Circumference --      Peak Flow --      Pain Score 02/13/24 1021 0     Pain Loc --      Pain Education --      Exclude from Growth Chart --    No data found.  Updated Vital Signs BP (!) 136/92   Pulse (!) 57   Temp 98 F (36.7 C)   Resp 20   SpO2 93%   Visual Acuity Right Eye Distance:   Left Eye Distance:   Bilateral Distance:    Right Eye Near:   Left Eye Near:    Bilateral Near:     Physical Exam Vitals and nursing note reviewed.  Constitutional:      General: He is not in acute distress.    Appearance: He is well-developed.  HENT:     Head: Normocephalic and atraumatic.  Eyes:     Conjunctiva/sclera: Conjunctivae normal.  Cardiovascular:     Rate and Rhythm: Normal rate and regular rhythm.     Heart sounds: No murmur heard. Pulmonary:     Effort: Pulmonary effort is normal. No respiratory distress.     Breath sounds: Normal breath sounds.  Abdominal:     Palpations: Abdomen is soft.     Tenderness: There is no abdominal tenderness.  Musculoskeletal:        General: No swelling.     Cervical back: Neck supple.  Skin:    General: Skin is warm and dry.     Capillary Refill: Capillary refill takes less than 2 seconds.  Neurological:     Mental Status: He is alert and oriented to person, place, and time.     Motor: Weakness present.     Comments: CN II-XII intact. Right upper extremity 4/5 strength compared to left upper extremity with 5/5 strength. Lower extremity unremarkable and at baseline. Unremarkable two point discrimination and graphesthesia.  Psychiatric:        Mood and Affect: Mood normal.      UC Treatments / Results  Labs (all labs ordered are listed, but only abnormal results are displayed) Labs Reviewed  POCT FASTING [facility] MANUAL ENTRY    EKG   Radiology No results found.  Procedures Procedures (including critical care  time)  Medications Ordered in UC Medications - No data to display  Initial Impression / Assessment and Plan / UC Course  I have reviewed the triage vital signs and the nursing notes.  Pertinent labs & imaging results that were available during my care of the patient were reviewed by me and considered in my medical decision making (see chart for details).     Patient with history of myasthenia gravis presents to urgent care with concerns of right sided weakness. Reports symptoms ongoing with primarily right upper extremity weakness with noted diminished grip strength over the last 2-3 weeks. Had a fall while at church this morning when trying to move chairs and felt increased difficulty with getting back up due to weakness in the right arm. No reported nausea, vomiting, diarrhea, fevers, chest pain, or shortness of breath.  Exam is consistent with slightly diminished right upper extremity motor weakness at 4/5 compared to 5/5 on left side. Lower extremities unremarkable.  Given patient's complex history with myasthenia gravis and fluctuating right upper extremity weakness, I advised patient that he would benefit from evaluation in the emergency department. Patient agreeable with this plan and will travel POV to [facility] Emergency Department.  Final Clinical Impressions(s) / UC Diagnoses   Final diagnoses:  Right sided weakness  Fall, initial encounter     Discharge Instructions      Please go directly to [facility] Emergency Department for further evaluation.      ED Prescriptions   None    PDMP not reviewed this encounter.   Opie Fanton A, PA-C 02/13/24 1118

## 2024-02-13 NOTE — ED Triage Notes (Signed)
 PT reports while at church this morning . Pt said he picked up a chair with RT arm and fell down on RT side . Pt reports he was weak on rt side. Pt attempted to stand up but felt weak and could not stand at first. Pt was able to stand on owen .  Pt has Myasthenia Gravis and takes Prednisone  daily. Pt ambulatory to room with out any type of assistance. Pt reports for a week or so his Rt has felt weak.

## 2024-02-17 ENCOUNTER — Ambulatory Visit: Admitting: Family Medicine

## 2024-02-17 VITALS — BP 142/88 | HR 68 | Ht 70.0 in | Wt 247.2 lb

## 2024-02-17 DIAGNOSIS — Z13220 Encounter for screening for lipoid disorders: Secondary | ICD-10-CM

## 2024-02-17 DIAGNOSIS — Z79899 Other long term (current) drug therapy: Secondary | ICD-10-CM | POA: Diagnosis not present

## 2024-02-17 DIAGNOSIS — G7000 Myasthenia gravis without (acute) exacerbation: Secondary | ICD-10-CM | POA: Diagnosis not present

## 2024-02-17 DIAGNOSIS — M8580 Other specified disorders of bone density and structure, unspecified site: Secondary | ICD-10-CM

## 2024-02-17 DIAGNOSIS — Z23 Encounter for immunization: Secondary | ICD-10-CM

## 2024-02-17 DIAGNOSIS — Z7952 Long term (current) use of systemic steroids: Secondary | ICD-10-CM

## 2024-02-17 NOTE — Progress Notes (Signed)
   Subjective:    Patient ID: Jay Rosario, male    DOB: [birth year], 58 y.o.   MRN: [id number]  Discussed the use of AI scribe software for clinical note transcription with the patient, who gave verbal consent to proceed.  History of Present Illness   Jay Rosario is a 58 year old male with myasthenia gravis who presents with muscle weakness.  He began experiencing muscle weakness three weeks ago, initially in his right arm while trimming hedges. The weakness was distinct from his usual strength levels.  On a recent Sunday, while moving chairs at church, he experienced significant weakness in his right arm and leg, leading to a fall. The weakness was sudden and occurred without pain, numbness, or tingling. He attempted to stand but stumbled due to continued weakness, which was a new and concerning experience for him.  He visited an urgent care on September 27th, where tests for stroke were conducted, but no significant findings were reported. He has a history of myasthenia gravis and has been on prednisone  for management and stable for several years.. His eyes have been bothering him, which he associates with his myasthenia gravis, and he has recently started wearing glasses at work.  Also early on in the process he did get electrophoresis done at [facility].  He has not seen a neurologist in years due to a  stable condition but follows up here for his myasthenia gravis management. He mentions working long hours, approximately 10-12 hours a day.  No numbness, tingling, loss of consciousness, blurred vision, or double vision. Reports visual disturbances related to myasthenia gravis.     He has had a recent DEXA scan which did show osteopenia.      Review of Systems     Objective:    Physical Exam Alert and in no distress.  EOMI.  Other cranial nerves grossly intact.  Normal upper and lower extremities.  Reflexes are 1+              Assessment & Plan:    Myasthenia gravis  (HCC) - Plan: CBC with Differential/Platelet, Comprehensive metabolic panel with GFR, Ambulatory referral to Neurology - Plan: Flu vaccine trivalent PF, 6mos and older(Flulaval,Afluria,Fluarix,Fluzone)   On prednisone  therapy  Encounter for long-term (current) use of high-risk medication - Plan: CBC with Differential/Platelet, Comprehensive metabolic panel with GFR, Lipid panel  Screening for lipid disorders - Plan: Lipid panel  Osteopenia, unspecified location

## 2024-02-18 LAB — LIPID PANEL
Chol/HDL Ratio: 3.3 ratio (ref 0.0–5.0)
Cholesterol, Total: 287 mg/dL — AB (ref 100–199)
HDL: 86 mg/dL (ref >39)
LDL Chol Calc (NIH): 191 mg/dL — AB (ref 0–99)
Triglycerides: 65 mg/dL (ref 0–149)
VLDL Cholesterol Cal: 10 mg/dL (ref 5–40)

## 2024-02-18 LAB — CBC WITH DIFFERENTIAL/PLATELET
Basophils Absolute: 0 x10E3/uL (ref 0.0–0.2)
Basos: 0 %
EOS (ABSOLUTE): 0 x10E3/uL (ref 0.0–0.4)
Eos: 0 %
Hematocrit: 51.1 % — ABNORMAL HIGH (ref 37.5–51.0)
Hemoglobin: 17.1 g/dL (ref 13.0–17.7)
Immature Grans (Abs): 0.1 x10E3/uL (ref 0.0–0.1)
Immature Granulocytes: 1 %
Lymphocytes Absolute: 0.8 x10E3/uL (ref 0.7–3.1)
Lymphs: 14 %
MCH: 32.1 pg (ref 26.6–33.0)
MCHC: 33.5 g/dL (ref 31.5–35.7)
MCV: 96 fL (ref 79–97)
Monocytes Absolute: 0.3 x10E3/uL (ref 0.1–0.9)
Monocytes: 5 %
Neutrophils Absolute: 4.3 x10E3/uL (ref 1.4–7.0)
Neutrophils: 80 %
Platelets: 226 x10E3/uL (ref 150–450)
RBC: 5.32 x10E6/uL (ref 4.14–5.80)
RDW: 12.7 % (ref 11.6–15.4)
WBC: 5.4 x10E3/uL (ref 3.4–10.8)

## 2024-02-18 LAB — COMPREHENSIVE METABOLIC PANEL WITH GFR
ALT: 68 IU/L — ABNORMAL HIGH (ref 0–44)
AST: 32 IU/L (ref 0–40)
Albumin: 4.4 g/dL (ref 3.8–4.9)
Alkaline Phosphatase: 59 IU/L (ref 47–123)
BUN/Creatinine Ratio: 15 (ref 9–20)
BUN: 14 mg/dL (ref 6–24)
Bilirubin Total: 0.4 mg/dL (ref 0.0–1.2)
CO2: 19 mmol/L — ABNORMAL LOW (ref 20–29)
Calcium: 10 mg/dL (ref 8.7–10.2)
Chloride: 102 mmol/L (ref 96–106)
Creatinine, Ser: 0.93 mg/dL (ref 0.76–1.27)
Globulin, Total: 2.7 g/dL (ref 1.5–4.5)
Glucose: 116 mg/dL — ABNORMAL HIGH (ref 70–99)
Potassium: 4.5 mmol/L (ref 3.5–5.2)
Sodium: 138 mmol/L (ref 134–144)
Total Protein: 7.1 g/dL (ref 6.0–8.5)
eGFR: 96 mL/min/1.73 (ref >59)

## 2024-02-19 ENCOUNTER — Ambulatory Visit: Payer: Self-pay | Admitting: Family Medicine

## 2024-02-19 NOTE — Progress Notes (Signed)
 I let Therisa know to add A1C.

## 2024-02-23 DIAGNOSIS — R7303 Prediabetes: Secondary | ICD-10-CM | POA: Insufficient documentation

## 2024-02-23 NOTE — Progress Notes (Signed)
 Done

## 2024-03-15 LAB — HGB A1C W/O EAG: Hgb A1c MFr Bld: 6 % — ABNORMAL HIGH (ref 4.8–5.6)

## 2024-03-15 LAB — SPECIMEN STATUS REPORT

## 2024-04-18 ENCOUNTER — Other Ambulatory Visit: Payer: Self-pay | Admitting: Family Medicine

## 2024-04-18 DIAGNOSIS — G7000 Myasthenia gravis without (acute) exacerbation: Secondary | ICD-10-CM

## 2024-04-18 NOTE — Telephone Encounter (Signed)
 Last appt 02/17/24.

## 2024-04-19 ENCOUNTER — Ambulatory Visit: Payer: 59 | Admitting: Family Medicine

## 2024-04-19 ENCOUNTER — Encounter: Payer: Self-pay | Admitting: Family Medicine

## 2024-04-19 VITALS — BP 124/82 | HR 62 | Ht 70.0 in | Wt 248.2 lb

## 2024-04-19 DIAGNOSIS — R0683 Snoring: Secondary | ICD-10-CM

## 2024-04-19 DIAGNOSIS — G7000 Myasthenia gravis without (acute) exacerbation: Secondary | ICD-10-CM

## 2024-04-19 DIAGNOSIS — N529 Male erectile dysfunction, unspecified: Secondary | ICD-10-CM

## 2024-04-19 DIAGNOSIS — Z136 Encounter for screening for cardiovascular disorders: Secondary | ICD-10-CM

## 2024-04-19 DIAGNOSIS — Z125 Encounter for screening for malignant neoplasm of prostate: Secondary | ICD-10-CM

## 2024-04-19 DIAGNOSIS — E669 Obesity, unspecified: Secondary | ICD-10-CM

## 2024-04-19 DIAGNOSIS — M79674 Pain in right toe(s): Secondary | ICD-10-CM

## 2024-04-19 DIAGNOSIS — Z0000 Encounter for general adult medical examination without abnormal findings: Secondary | ICD-10-CM | POA: Diagnosis not present

## 2024-04-19 LAB — COMPREHENSIVE METABOLIC PANEL WITH GFR
ALT: 66 IU/L — ABNORMAL HIGH (ref 0–44)
AST: 31 IU/L (ref 0–40)
Albumin: 4.1 g/dL (ref 3.8–4.9)
Alkaline Phosphatase: 52 IU/L (ref 47–123)
BUN/Creatinine Ratio: 9 (ref 9–20)
BUN: 10 mg/dL (ref 6–24)
Bilirubin Total: 0.4 mg/dL (ref 0.0–1.2)
CO2: 20 mmol/L (ref 20–29)
Calcium: 9.2 mg/dL (ref 8.7–10.2)
Chloride: 105 mmol/L (ref 96–106)
Creatinine, Ser: 1.06 mg/dL (ref 0.76–1.27)
Globulin, Total: 2.4 g/dL (ref 1.5–4.5)
Glucose: 107 mg/dL — ABNORMAL HIGH (ref 70–99)
Potassium: 4.1 mmol/L (ref 3.5–5.2)
Sodium: 139 mmol/L (ref 134–144)
Total Protein: 6.5 g/dL (ref 6.0–8.5)
eGFR: 82 mL/min/1.73 (ref >59)

## 2024-04-19 LAB — CBC WITH DIFFERENTIAL/PLATELET
Basophils Absolute: 0 x10E3/uL (ref 0.0–0.2)
Basos: 1 %
EOS (ABSOLUTE): 0.1 x10E3/uL (ref 0.0–0.4)
Eos: 2 %
Hematocrit: 49.9 % (ref 37.5–51.0)
Hemoglobin: 16.3 g/dL (ref 13.0–17.7)
Immature Grans (Abs): 0 x10E3/uL (ref 0.0–0.1)
Immature Granulocytes: 0 %
Lymphocytes Absolute: 1.7 x10E3/uL (ref 0.7–3.1)
Lymphs: 38 %
MCH: 32 pg (ref 26.6–33.0)
MCHC: 32.7 g/dL (ref 31.5–35.7)
MCV: 98 fL — ABNORMAL HIGH (ref 79–97)
Monocytes Absolute: 0.7 x10E3/uL (ref 0.1–0.9)
Monocytes: 17 %
Neutrophils Absolute: 1.9 x10E3/uL (ref 1.4–7.0)
Neutrophils: 41 %
Platelets: 202 x10E3/uL (ref 150–450)
RBC: 5.1 x10E6/uL (ref 4.14–5.80)
RDW: 12.8 % (ref 11.6–15.4)
WBC: 4.4 x10E3/uL (ref 3.4–10.8)

## 2024-04-19 LAB — POCT GLYCOSYLATED HEMOGLOBIN (HGB A1C): Hemoglobin A1C: 6 % — AB (ref 4.0–5.6)

## 2024-04-19 LAB — LIPID PANEL
Chol/HDL Ratio: 4 ratio (ref 0.0–5.0)
Cholesterol, Total: 264 mg/dL — ABNORMAL HIGH (ref 100–199)
HDL: 66 mg/dL (ref >39)
LDL Chol Calc (NIH): 179 mg/dL — ABNORMAL HIGH (ref 0–99)
Triglycerides: 110 mg/dL (ref 0–149)
VLDL Cholesterol Cal: 19 mg/dL (ref 5–40)

## 2024-04-19 MED ORDER — TADALAFIL 20 MG PO TABS: ORAL_TABLET | ORAL | 5 refills | Status: AC

## 2024-04-19 NOTE — Progress Notes (Signed)
 Name: KARTHIKEYA FUNKE   Date of Visit: 04/19/24   Date of last visit with me: Visit date not found   CHIEF COMPLAINT:  Chief Complaint  Patient presents with   Annual Exam    CPE fasting labs, rt. Foot big toe has a knot on it and hurts, for about 2-3 weeks,        HPI:  Discussed the use of AI scribe software for clinical note transcription with the patient, who gave verbal consent to proceed.  History of Present Illness   MOSE COLAIZZI is a 58 year old male with myasthenia gravis who presents with right big toe pain.  He has been experiencing pain in his right big toe for the past three to four weeks. He describes a 'knot' underneath the toe that causes significant pain when weight is applied. There is no known trauma or injury to the area. Initially, the area was sore but has since become more painful, impacting his ability to put pressure on the toe.  He has a history of myasthenia gravis and is currently taking prednisone  10 mg twice daily. He had reduced the dose to once daily due to side effects but resumed the original dosing after experiencing weakness on his right side, which led to a fall. Following the increase in prednisone , his symptoms improved.  He is no longer taking Flomax  and has been on apixaban  (Eliquis ) for an unspecified duration. He also mentions using Cialis , for which he requested a refill.  During the review of symptoms, he reports significant snoring but has not undergone a sleep study.         OBJECTIVE:       04/19/2024    8:15 AM  Depression screen PHQ 2/9  Decreased Interest 0  Down, Depressed, Hopeless 0  PHQ - 2 Score 0     BP Readings from Last 3 Encounters:  04/19/24 124/82  02/17/24 (!) 142/88  02/13/24 (!) 136/92    BP 124/82   Pulse 62   Ht 5' 10 (1.778 m)   Wt 248 lb 3.2 oz (112.6 kg)   BMI 35.61 kg/m    Physical Exam   EXTREMITIES: Tender knot on the right big toe, possible callus with underlying hematoma.       Physical Exam Constitutional:      Appearance: Normal appearance.  Neurological:     General: No focal deficit present.     Mental Status: He is alert and oriented to person, place, and time. Mental status is at baseline.     ASSESSMENT/PLAN:   Assessment & Plan Annual physical exam  Erectile dysfunction, unspecified erectile dysfunction type  Myasthenia gravis (HCC)  Obesity (BMI 30-39.9)  Snoring  Encounter for screening for cardiovascular disorders  Great toe pain, right  Encounter for screening prostate specific antigen (PSA) measurement    Assessment and Plan    Adult Wellness Visit Visit focused on addressing specific health concerns, including painful callus on right big toe, myasthenia gravis management, and snoring. - Patient up to date on vaccines and screenings -Comprehensive annual physical exam completed today. Reviewed interval history, current medical issues, medications, allergies, and preventive care needs. Addressed all patient questions and concerns. Discussed lifestyle factors including diet, exercise, sleep, and stress management. Reviewed recommended age-appropriate screenings, labs, and vaccinations. Counseling provided on healthy habits and routine health maintenance. Follow-up as indicated based on findings and results.  Myasthenia gravis Managed with prednisone  10 mg twice daily. Discussed long-term steroid side  effects and potential future use of azathioprine for steroid sparing. He prefers current regimen. - Continue prednisone  10 mg twice daily. - Consider azathioprine in the future for steroid sparing.  Pain and callus with possible hematoma, right big toe Painful callus with tenderness and possible hematoma. Infection less likely. Referral to podiatrist for further evaluation and potential removal. - Referred to podiatrist for evaluation and possible removal of callus.  Male erectile dysfunction Cialis  prescription refilled. - Refilled  Cialis  prescription.  Snoring Reports significant snoring. Discussed potential benefits of a sleep study for sleep apnea assessment. - Recommended sleep study to evaluate for sleep apnea.         Latishia Suitt A. Vita MD [facility]

## 2024-04-20 ENCOUNTER — Ambulatory Visit: Payer: Self-pay | Admitting: Family Medicine

## 2024-04-20 LAB — PSA: Prostate Specific Ag, Serum: 0.7 ng/mL (ref 0.0–4.0)

## 2024-05-23 NOTE — Progress Notes (Unsigned)
 "  Initial neurology clinic note  Reason for Evaluation: Consultation requested by Joyce Norleen BROCKS, MD for an opinion regarding weakness. My final recommendations will be communicated back to the requesting physician by way of shared medical record or letter to requesting physician via [place]  mail.  HPI: This is [name], a 59 y.o. ***-handed male with a medical history of myasthenia gravis, HLD, pre-DM*** who presents to neurology clinic with the chief complaint of weakness***. The patient is accompanied by ***.  *** Weakness with fall - no pain, numbness/tingling (late 01/2024) Went to urgent care on 02/13/24 and was worked up for stroke due to RUE weakness Was to go to ED for stroke work up - didn't go? I don't see any work up  Hx of MG? Taking prednisone  10 mg twice daily - how long? Had reduced to once daily and experienced the right sided weakness When he increased prednisone , symptoms improved again  Per [facility] neurology note from 11/02/2014: 59 y/o RH black male with ocular MG.   Patient maintaining on prednisone  20 mg daily. Has not made any significant attempt to reduce dose to 15 mg. No significant side effect. Remains adherent with azathioprine. Taking 100 mg twice daily. Not consistently taking Mestinon. Since last visit has had 2 or 3 separate occasions where exposure to heat brought on some transient diplopia. No consistent ptosis, proximal strength changes, dyspnea, dysphagia. No chest pain, palpitations, shortness of breath or cough.  Has not had much of any significant cramps as of late. Did attempt cyclobenzaprine with some reasonable benefit but didn't not recently take. No back pain.   Why on eliquis ?  The patient has not*** had similar episodes of symptoms in the past. ***  Muscle bulk loss? *** Muscle pain? ***  Cramps/Twitching? *** Suggestion of myotonia/difficulty relaxing after contraction? ***  Fatigable weakness?*** Does strength improve after  brief exercise?***  Able to brush hair/teeth without difficulty? *** Able to button shirts/use zips? *** Clumsiness/dropping grasped objects?*** Can you arise from squatted position easily? *** Able to get out of chair without using arms? *** Able to walk up steps easily? *** Use an assistive device to walk? *** Significant imbalance with walking? *** Falls?*** Any change in urine color, especially after exertion/physical activity? ***  The patient denies*** symptoms suggestive of oculobulbar weakness including diplopia, ptosis, dysphagia, poor saliva control, dysarthria/dysphonia, impaired mastication, facial weakness/droop.  There are no*** neuromuscular respiratory weakness symptoms, particularly orthopnea>dyspnea.   Pseudobulbar affect is absent***.  The patient does not*** report symptoms referable to autonomic dysfunction including impaired sweating, heat or cold intolerance, excessive mucosal dryness, gastroparetic early satiety, postprandial abdominal bloating, constipation, bowel or bladder dyscontrol, erectile dysfunction*** or syncope/presyncope/orthostatic intolerance.  There are no*** complaints relating to other symptoms of small fiber modalities including paresthesia/pain.  The patient has not *** noticed any recent skin rashes nor does he*** report any constitutional symptoms like fever, night sweats, anorexia or unintentional weight loss.  EtOH use: ***  Restrictive diet? *** Family history of neuropathy/myopathy/NM disease?***  Previous labs, electrodiagnostics, and neuroimaging are summarized below, but pertinent findings include***  Any biopsy done? *** Current medications being tried for the patient's symptoms include ***  Prior medications that have been tried: ***   MEDICATIONS:  Outpatient Encounter Medications as of 06/02/2024  Medication Sig   predniSONE  (DELTASONE ) 10 MG tablet TAKE 1 TABLET(10 MG) BY MOUTH TWICE DAILY   tadalafil  (CIALIS ) 20 MG tablet TAKE 1  TABLET BY MOUTH DAILY AS NEEDED FOR  ERECTILE DYSFUNCTION   No facility-administered encounter medications on file as of 06/02/2024.    PAST MEDICAL HISTORY: Past Medical History:  Diagnosis Date   Dyslipidemia    Myasthenia gravis    Obesity    Positive TB test     PAST SURGICAL HISTORY: Past Surgical History:  Procedure Laterality Date   cyst removed     upper left cyst removed from chest   TENDON REPAIR Bilateral 04/30/2021   Procedure: QUAD TENDON REPAIR;  Surgeon: Doll Skates, MD;  Location: [facility] OR;  Service: Orthopedics;  Laterality: Bilateral;    ALLERGIES: Allergies[1]  FAMILY HISTORY: Family History  Problem Relation Age of Onset   Hypertension Mother     SOCIAL HISTORY: Social History[2] Social History   Social History Narrative   Not on file     OBJECTIVE: PHYSICAL EXAM: There were no vitals taken for this visit.  General:*** General appearance: Awake and alert. No distress. Cooperative with exam.  Skin: No obvious rash or jaundice. HEENT: Atraumatic. Anicteric. Lungs: Non-labored breathing on room air  Heart: Regular Abdomen: Soft, non tender. Extremities: No edema. No obvious deformity.  Musculoskeletal: No obvious joint swelling. Psych: Affect appropriate.  Neurological: Mental Status: Alert. Speech fluent. No pseudobulbar affect Cranial Nerves: CNII: No RAPD. Visual fields grossly intact. CNIII, IV, VI: PERRL. No nystagmus. EOMI. CN V: Facial sensation intact bilaterally to fine touch. Masseter clench strong. Jaw jerk***. CN VII: Facial muscles symmetric and strong. No ptosis at rest or after sustained upgaze***. CN VIII: Hearing grossly intact bilaterally. CN IX: No hypophonia. CN X: Palate elevates symmetrically. CN XI: Full strength shoulder shrug bilaterally. CN XII: Tongue protrusion full and midline. No atrophy or fasciculations. No significant dysarthria*** Motor: Tone is ***. *** fasciculations in *** extremities. ***  atrophy. No grip or percussive myotonia.***  Individual muscle group testing (MRC grade out of 5):  Movement     Neck flexion ***    Neck extension ***     Right Left   Shoulder abduction *** ***   Shoulder adduction *** ***   Shoulder ext rotation *** ***   Shoulder int rotation *** ***   Elbow flexion *** ***   Elbow extension *** ***   Wrist extension *** ***   Wrist flexion *** ***   Finger abduction - FDI *** ***   Finger abduction - ADM *** ***   Finger extension *** ***   Finger distal flexion - 2/3 *** ***   Finger distal flexion - 4/5 *** ***   Thumb flexion - FPL *** ***   Thumb abduction - APB *** ***    Hip flexion *** ***   Hip extension *** ***   Hip adduction *** ***   Hip abduction *** ***   Knee extension *** ***   Knee flexion *** ***   Dorsiflexion *** ***   Plantarflexion *** ***   Inversion *** ***   Eversion *** ***   Great toe extension *** ***   Great toe flexion *** ***     Reflexes:  Right Left   Bicep *** ***   Tricep *** ***   BrRad *** ***   Knee *** ***   Ankle *** ***    Pathological Reflexes: Babinski: *** response bilaterally*** Hoffman: *** Troemner: *** Pectoral: *** Palmomental: *** Facial: *** Midline tap: *** Sensation: Pinprick: *** Vibration: *** Temperature: *** Proprioception: *** Coordination: Intact finger-to- nose-finger bilaterally. Romberg negative.*** Gait: Able to rise from chair with arms crossed unassisted.  Normal, narrow-based gait. Able to tandem walk. Able to walk on toes and heels.***  Lab and Test Review: Internal labs: 04/19/24: Lipid panel: tChol 264, LDL 179, TG 110 HbA1c: 6.0 CBC w/ diff significant for MCV of 98 CMP significant for glucose 107, ALT 66  CK: 05/04/21: 369 05/01/21: 1805  External labs: 04/07/2005: B12 564 TSH wnl [facility] abs ([facility]): binding positive at 0.73, modulating 0, striational negative  Imaging/Procedures: CT head wo contrast (06/24/2001): THERE IS  NO EVIDENCE OF ACUTE INTRACRANIAL HEMORRHAGE, MASS EFFECT OR EXTRA-AXIAL FLUID COLLECTION.   THE VENTRICLES AND SUBARACHNOID SPACES ARE APPROPRIATELY SIZED FOR AGE.  THE VISUALIZED PARANASAL  SINUSES ARE CLEAR.  NO CALVARIAL ABNORMALITY SEEN.   ORBITS APPEAR NORMAL.  IMPRESSION  NEGATIVE UNENHANCED CT OF THE HEAD.   EMG ([facility] 04/07/2005): CLINICAL IMPRESSION:  59 year old male presents for evaluation of recurrent episodes of diplopia  and ptosis. The most recent episode started 3 weeks ago and is associated  with double vision in primary gaze. He denies any other symptoms including  ptosis, dysarthria, dysphagia, difficulty swallowing, shortness of breath or  general weakness.  On examination, he has moderate eye closure weakness with mild ptosis  bilaterally. There is significant restriction of his extraocular muscle  movements. His limb muscle strength is normal throughout. Sensation to  pinprick is normal throughout. Reflexes are intact.   SUMMARY  NCS: Right peroneal motor responses were normal. Right sural response was  normal.  EMG: Needle evaluation of proximal and distal right upper extremity muscles  was normal.  SFEMG: 13 action potential pairs were tested in the left frontalis muscle.  The mean jitter was 31.8  sec, 8% fiber pairs showed increased jitter and  92% pairs were normal. 12 action potential pairs were tested in the left  orbicularis oculi. The mean jitter was 99.0  sec, 33% of fiber pairs showed  increased jitter, 42% pairs showed increased jitter and blocking, and 25% of  pairs were normal.   CONCLUSION  This is an abnormal study. There is electrodiagnostic evidence of  neuromuscular junction disorder. These results, along with the physical  examination, are consistent with Myasthenia Gravis.   CT chest (04/30/2012 external): Findings: Limited evaluation the abdomen without intravenous contrast  is unremarkable.   The heart is normal in size. Small hiatal  hernia. No pleural or  pericardial effusion. The aorta and its branch vessels appear within  normal limits. Pulmonary arteries are normal in caliber. No  pathologically enlarged hilar or mediastinal lymphadenopathy.  Prominent bilateral axillary lymph nodes measuring 1.8 cm retain  their fatty hila.   Less than 4 mm nodular opacity right lower lobe (image 27). The lungs  are otherwise within normal limits.   Visualized osseous structures are within normal limits.   Impression:  1. Negative for a soft tissue mass in the anterior mediastinum.  2. Prominent axillary lymph nodes of uncertain clinical significance,  most likely reactive. Recommend clinical correlation.  3. Less than 4 mm right lower lobe nodular opacity. If the patient is  at low risk for lung cancer, no followup is required. If high risk,  recommend one year followup to document stability.  ***  ASSESSMENT: Jay Rosario is a 59 y.o. male who presents for evaluation of ***. *** has a relevant medical history of ***. *** neurological examination is pertinent for ***. Available diagnostic data is significant for ***. This constellation of symptoms and objective data would most likely localize to ***. ***  PLAN: -Blood work: ***MG panel ***  -Return to clinic ***  The impression above as well as the plan as outlined below were extensively discussed with the patient (in the company of ***) who voiced understanding. All questions were answered to their satisfaction.  The patient was counseled on pertinent fall precautions per the printed material provided today, and as noted under the Patient Instructions section below.***  When available, results of the above investigations and possible further recommendations will be communicated to the patient via telephone/MyChart. Patient to call office if not contacted after expected testing turnaround time.   Total time spent reviewing records, interview, history/exam,  documentation, and coordination of care on day of encounter:  *** min   Thank you for allowing me to participate in patient's care.  If I can answer any additional questions, I would be pleased to do so.  Venetia Potters, MD   CC: Joyce Norleen BROCKS, MD [address] [place] [address]  CC: Referring provider: Joyce Norleen BROCKS, MD [address] [place],  [place] [address]    [1] No Known Allergies [2]  Social History Tobacco Use   Smoking status: Never   Smokeless tobacco: Never  Vaping Use   Vaping status: Never Used  Substance Use Topics   Alcohol use: No   Drug use: No   "

## 2024-06-02 ENCOUNTER — Ambulatory Visit: Admitting: Neurology

## 2024-06-06 ENCOUNTER — Ambulatory Visit: Admitting: Podiatry

## 2024-06-06 ENCOUNTER — Encounter: Payer: Self-pay | Admitting: Podiatry

## 2024-06-06 DIAGNOSIS — B070 Plantar wart: Secondary | ICD-10-CM

## 2024-06-06 NOTE — Progress Notes (Signed)
 Subjective:   Patient ID: Jay Rosario, male   DOB: 59 y.o.   MRN: [id number]   HPI Patient presents with a lot of pain underneath his right big toe states its been this way for several months when the lesion appeared.  He does have dry skin but this seems different to him and patient does not smoke and likes to be active and is in a lot of discomfort with this particular area   Review of Systems  All other systems reviewed and are negative.       Objective:  Physical Exam Vitals and nursing note reviewed.  Constitutional:      Appearance: He is well-developed.  Pulmonary:     Effort: Pulmonary effort is normal.  Musculoskeletal:        General: Normal range of motion.  Skin:    General: Skin is warm.  Neurological:     Mental Status: He is alert.     Neurovascular status intact muscle strength to be adequate range of motion adequate with the patient found to have severe lesion plantar left hallux measuring approximately 7 mm x 8 mm.  Upon debridement pinpoint bleeding is noted and it is painful to lateral pressure and he also has a lucent cord lesions in the plantar of the foot right over the left     Assessment:  Mobility that this is a verruca plantaris versus callus but does also have porokeratotic type tissue     Plan:  H&P reviewed today I went ahead I anesthetized the right big toe 60 mg like Marcaine  mixture sterile prep done using sterile instrumentation I excised the mass curettage applied phenol to the base with sterile dressing placed in formalin and I am sending for evaluation and I advised on reduced weightbearing today.  Reappoint as symptoms indicate and we will evaluate pathology

## 2024-06-06 NOTE — Addendum Note (Signed)
 Addended by: Maisha Bogen L on: 06/06/2024 05:24 PM   Modules accepted: Orders

## 2024-09-07 ENCOUNTER — Ambulatory Visit: Admitting: Neurology
